# Patient Record
Sex: Male | Born: 1945 | Race: White | Hispanic: No | Smoking: Never smoker
Health system: Southern US, Community
[De-identification: ages and names within clinical notes are randomized; demographics above are authoritative.]

## PROBLEM LIST (undated history)

## (undated) DIAGNOSIS — F419 Anxiety disorder, unspecified: Secondary | ICD-10-CM

## (undated) DIAGNOSIS — R634 Abnormal weight loss: Secondary | ICD-10-CM

## (undated) DIAGNOSIS — E785 Hyperlipidemia, unspecified: Secondary | ICD-10-CM

## (undated) DIAGNOSIS — N200 Calculus of kidney: Secondary | ICD-10-CM

## (undated) DIAGNOSIS — N289 Disorder of kidney and ureter, unspecified: Secondary | ICD-10-CM

## (undated) DIAGNOSIS — B009 Herpesviral infection, unspecified: Secondary | ICD-10-CM

## (undated) DIAGNOSIS — M1990 Unspecified osteoarthritis, unspecified site: Secondary | ICD-10-CM

## (undated) DIAGNOSIS — R9389 Abnormal findings on diagnostic imaging of other specified body structures: Secondary | ICD-10-CM

## (undated) HISTORY — DX: Disorder of kidney and ureter, unspecified: N28.9

## (undated) HISTORY — DX: Herpesviral infection, unspecified: B00.9

## (undated) HISTORY — DX: Hyperlipidemia, unspecified: E78.5

## (undated) HISTORY — PX: URETERAL STENT PLACEMENT: SHX822

## (undated) HISTORY — DX: Abnormal weight loss: R63.4

## (undated) HISTORY — DX: Abnormal findings on diagnostic imaging of other specified body structures: R93.89

## (undated) HISTORY — DX: Anxiety disorder, unspecified: F41.9

## (undated) HISTORY — DX: Calculus of kidney: N20.0

## (undated) HISTORY — PX: OTHER SURGICAL HISTORY: SHX169

---

## 1996-03-06 HISTORY — PX: REFRACTIVE SURGERY: SHX103

## 2005-08-08 ENCOUNTER — Ambulatory Visit: Payer: Self-pay | Admitting: Internal Medicine

## 2005-08-29 ENCOUNTER — Ambulatory Visit: Payer: Self-pay

## 2005-11-28 ENCOUNTER — Ambulatory Visit: Payer: Self-pay | Admitting: Internal Medicine

## 2007-04-03 ENCOUNTER — Telehealth: Payer: Self-pay | Admitting: Internal Medicine

## 2007-04-08 ENCOUNTER — Telehealth: Payer: Self-pay | Admitting: Internal Medicine

## 2007-04-10 ENCOUNTER — Telehealth: Payer: Self-pay | Admitting: Internal Medicine

## 2007-12-11 ENCOUNTER — Ambulatory Visit (HOSPITAL_COMMUNITY): Payer: Self-pay | Admitting: Psychology

## 2007-12-17 ENCOUNTER — Ambulatory Visit (HOSPITAL_COMMUNITY): Payer: Self-pay | Admitting: Psychology

## 2008-10-27 ENCOUNTER — Telehealth (INDEPENDENT_AMBULATORY_CARE_PROVIDER_SITE_OTHER): Payer: Self-pay | Admitting: *Deleted

## 2008-11-03 ENCOUNTER — Ambulatory Visit: Payer: Self-pay | Admitting: Internal Medicine

## 2008-11-03 DIAGNOSIS — J189 Pneumonia, unspecified organism: Secondary | ICD-10-CM

## 2009-05-03 ENCOUNTER — Telehealth: Payer: Self-pay | Admitting: Internal Medicine

## 2010-04-05 NOTE — Progress Notes (Signed)
Summary: Viagra  Phone Note Call from Patient   Summary of Call: Patient left message on triage that he is on Cialis, but would like to go back to Viagra. Patient needs new prescription. Initial call taken by: Lucious Groves,  May 03, 2009 4:32 PM  Follow-up for Phone Call        OK for viagra 100mg : 1/2 or 1 as needed, #6, refill as needed. Follow-up by: Jacques Navy MD,  May 03, 2009 4:40 PM  Additional Follow-up for Phone Call Additional follow up Details #1::        No one at hm #, wk # incorrect Additional Follow-up by: Lamar Sprinkles, CMA,  May 04, 2009 8:07 AM    New/Updated Medications: VIAGRA 100 MG TABS (SILDENAFIL CITRATE) 1/2 or 1 prn Prescriptions: VIAGRA 100 MG TABS (SILDENAFIL CITRATE) 1/2 or 1 prn  #6 x 12   Entered by:   Lamar Sprinkles, CMA   Authorized by:   Jacques Navy MD   Signed by:   Lamar Sprinkles, CMA on 05/04/2009   Method used:   Electronically to        [facility] [phone]* (retail)       [address]       [place], [place]  [address]       Ph: [phone] or [phone]       Fax: [phone]   RxID:   [id number]

## 2010-07-22 NOTE — Letter (Signed)
April 05, 2007    To Whom It May Concern:   RE:  [name], [name]  MRN:  [id number]  /  DOB:  [birth year]   [name] is a patient I follow for general medical care.  He  actually is a healthy gentleman, who had a wellness examination August 08, 2005.  As part of routine evaluation and because of a family history of  atherosclerotic vascular disease, the patient had a 2D echo performed  August 29, 2005.  This study was read out as showing no evidence of any  carotid artery disease.   Based on the patient's physical exam, history and study, I would say  that he has no risk for carotid artery disease or atherosclerotic  vascular disease at this time.   If I can provide any additional information, with appropriate patient  consent, please feel free to contact me.    Sincerely,      Rosalyn Gess. Norins, MD  Electronically Signed    MEN/MedQ  DD: 04/04/2007  DT: 04/05/2007  Job #: [id number]

## 2011-04-15 ENCOUNTER — Encounter: Payer: Self-pay | Admitting: *Deleted

## 2011-04-15 DIAGNOSIS — F329 Major depressive disorder, single episode, unspecified: Secondary | ICD-10-CM | POA: Insufficient documentation

## 2011-04-15 DIAGNOSIS — F419 Anxiety disorder, unspecified: Secondary | ICD-10-CM | POA: Insufficient documentation

## 2011-04-16 ENCOUNTER — Ambulatory Visit: Payer: Self-pay

## 2011-04-16 ENCOUNTER — Other Ambulatory Visit: Payer: Self-pay | Admitting: Internal Medicine

## 2011-04-16 DIAGNOSIS — Z0000 Encounter for general adult medical examination without abnormal findings: Secondary | ICD-10-CM

## 2011-04-17 ENCOUNTER — Ambulatory Visit: Payer: Medicare Other

## 2011-04-17 ENCOUNTER — Ambulatory Visit (INDEPENDENT_AMBULATORY_CARE_PROVIDER_SITE_OTHER): Payer: Medicare Other | Admitting: Internal Medicine

## 2011-04-17 ENCOUNTER — Encounter: Payer: Self-pay | Admitting: Internal Medicine

## 2011-04-17 ENCOUNTER — Ambulatory Visit: Payer: Self-pay | Admitting: Internal Medicine

## 2011-04-17 VITALS — BP 108/76 | HR 56 | Temp 97.8°F | Resp 16 | Ht 73.0 in | Wt 186.0 lb

## 2011-04-17 DIAGNOSIS — R942 Abnormal results of pulmonary function studies: Secondary | ICD-10-CM

## 2011-04-17 DIAGNOSIS — B353 Tinea pedis: Secondary | ICD-10-CM

## 2011-04-17 DIAGNOSIS — M549 Dorsalgia, unspecified: Secondary | ICD-10-CM

## 2011-04-17 DIAGNOSIS — G8929 Other chronic pain: Secondary | ICD-10-CM

## 2011-04-17 DIAGNOSIS — N529 Male erectile dysfunction, unspecified: Secondary | ICD-10-CM

## 2011-04-17 DIAGNOSIS — M545 Low back pain: Secondary | ICD-10-CM

## 2011-04-17 DIAGNOSIS — Z0000 Encounter for general adult medical examination without abnormal findings: Secondary | ICD-10-CM

## 2011-04-17 LAB — POCT URINALYSIS DIPSTICK
Bilirubin, UA: NEGATIVE
Glucose, UA: NEGATIVE
Leukocytes, UA: NEGATIVE
Nitrite, UA: NEGATIVE
Urobilinogen, UA: 0.2
pH, UA: 6

## 2011-04-17 LAB — POCT UA - MICROSCOPIC ONLY
Casts, Ur, LPF, POC: NEGATIVE
Crystals, Ur, HPF, POC: NEGATIVE
Yeast, UA: NEGATIVE

## 2011-04-17 LAB — LIPID PANEL
Cholesterol: 188 mg/dL (ref 0–200)
VLDL: 43 mg/dL — ABNORMAL HIGH (ref 0–40)

## 2011-04-17 LAB — COMPREHENSIVE METABOLIC PANEL
ALT: 33 U/L (ref 0–53)
Albumin: 4.1 g/dL (ref 3.5–5.2)
CO2: 26 mEq/L (ref 19–32)
Chloride: 105 mEq/L (ref 96–112)
Potassium: 4.5 mEq/L (ref 3.5–5.3)
Sodium: 139 mEq/L (ref 135–145)
Total Bilirubin: 0.5 mg/dL (ref 0.3–1.2)
Total Protein: 6.9 g/dL (ref 6.0–8.3)

## 2011-04-17 LAB — CBC WITH DIFFERENTIAL/PLATELET
Eosinophils Absolute: 0.5 10*3/uL (ref 0.0–0.7)
Hemoglobin: 15.4 g/dL (ref 13.0–17.0)
Lymphocytes Relative: 20 % (ref 12–46)
Lymphs Abs: 0.8 10*3/uL (ref 0.7–4.0)
Monocytes Relative: 8 % (ref 3–12)
Neutro Abs: 2.4 10*3/uL (ref 1.7–7.7)
Neutrophils Relative %: 59 % (ref 43–77)
Platelets: 208 10*3/uL (ref 150–400)
RBC: 4.99 MIL/uL (ref 4.22–5.81)
WBC: 4.1 10*3/uL (ref 4.0–10.5)

## 2011-04-17 MED ORDER — SILDENAFIL CITRATE 100 MG PO TABS: 50.0000 mg | ORAL_TABLET | Freq: Every day | ORAL | Status: DC | PRN

## 2011-04-17 MED ORDER — TERBINAFINE HCL 250 MG PO TABS: 250.0000 mg | ORAL_TABLET | Freq: Every day | ORAL | Status: AC

## 2011-04-17 MED ORDER — ALPRAZOLAM 1 MG PO TABS: 1.0000 mg | ORAL_TABLET | Freq: Every evening | ORAL | Status: AC | PRN

## 2011-04-17 NOTE — Progress Notes (Signed)
  Subjective:    Patient ID: [name], male    DOB: September 23, 1945, 66 y.o.   MRN: [id number]  HPISee scanned review of entire hx.    Review of Systems    See scanned hx. Objective:   Physical Exam  Constitutional: He is oriented to person, place, and time. He appears well-developed and well-nourished.  HENT:  Head: Normocephalic.  Eyes: EOM are normal. Pupils are equal, round, and reactive to light.  Neck: Normal range of motion. Neck supple.  Cardiovascular: Normal rate and regular rhythm.   Pulmonary/Chest: Effort normal and breath sounds normal.  Abdominal: Soft. Bowel sounds are normal. He exhibits no mass.  Genitourinary: Rectum normal, prostate normal and penis normal.  Musculoskeletal: Normal range of motion. He exhibits tenderness.  Neurological: He is alert and oriented to person, place, and time. He has normal reflexes. No cranial nerve deficit. Coordination normal.  Skin: Skin is warm and dry.  Psychiatric: He has a normal mood and affect.          Assessment & Plan:  Restrictive PFT Insomnia ED Low back pain with DDD Tinea pedis onychomycosis  Mr, [name] prefers to increase his cardio w/out and retest PFTs in a few months  See meds ordered  [facility] reading (PRIMARY) by  Dr.Tyniya Kuyper  CXR NAD, LS spine severe DDD L 2/3 scoliosis.

## 2011-04-18 ENCOUNTER — Encounter: Payer: Self-pay | Admitting: Internal Medicine

## 2011-04-18 LAB — PULMONARY FUNCTION TEST

## 2012-03-06 HISTORY — PX: URETERAL STENT PLACEMENT: SHX822

## 2012-07-13 ENCOUNTER — Ambulatory Visit (INDEPENDENT_AMBULATORY_CARE_PROVIDER_SITE_OTHER): Payer: Medicare Other | Admitting: Internal Medicine

## 2012-07-13 VITALS — BP 96/78 | HR 75 | Temp 97.7°F | Resp 16 | Ht 73.5 in | Wt 185.6 lb

## 2012-07-13 DIAGNOSIS — E789 Disorder of lipoprotein metabolism, unspecified: Secondary | ICD-10-CM

## 2012-07-13 DIAGNOSIS — Z8249 Family history of ischemic heart disease and other diseases of the circulatory system: Secondary | ICD-10-CM

## 2012-07-13 DIAGNOSIS — M545 Low back pain: Secondary | ICD-10-CM

## 2012-07-13 DIAGNOSIS — B351 Tinea unguium: Secondary | ICD-10-CM

## 2012-07-13 DIAGNOSIS — Z139 Encounter for screening, unspecified: Secondary | ICD-10-CM

## 2012-07-13 DIAGNOSIS — N529 Male erectile dysfunction, unspecified: Secondary | ICD-10-CM

## 2012-07-13 DIAGNOSIS — Z719 Counseling, unspecified: Secondary | ICD-10-CM

## 2012-07-13 DIAGNOSIS — Z0000 Encounter for general adult medical examination without abnormal findings: Secondary | ICD-10-CM

## 2012-07-13 LAB — POCT CBC
Granulocyte percent: 72 %G (ref 37–80)
MCH, POC: 31.5 pg — AB (ref 27–31.2)
MID (cbc): 0.3 (ref 0–0.9)
MPV: 9.3 fL (ref 0–99.8)
POC Granulocyte: 3.5 (ref 2–6.9)
POC MID %: 6.4 %M (ref 0–12)
Platelet Count, POC: 227 10*3/uL (ref 142–424)
RBC: 4.96 M/uL (ref 4.69–6.13)

## 2012-07-13 LAB — LIPID PANEL
HDL: 40 mg/dL (ref >39)
LDL Cholesterol: 108 mg/dL — ABNORMAL HIGH (ref 0–99)
Total CHOL/HDL Ratio: 4.7 Ratio

## 2012-07-13 LAB — COMPREHENSIVE METABOLIC PANEL
ALT: 31 U/L (ref 0–53)
AST: 25 U/L (ref 0–37)
Calcium: 9.7 mg/dL (ref 8.4–10.5)
Chloride: 103 mEq/L (ref 96–112)
Creat: 1.2 mg/dL (ref 0.50–1.35)
Sodium: 137 mEq/L (ref 135–145)
Total Protein: 7.4 g/dL (ref 6.0–8.3)

## 2012-07-13 LAB — POCT URINALYSIS DIPSTICK
Bilirubin, UA: NEGATIVE
Glucose, UA: NEGATIVE
Ketones, UA: NEGATIVE
Leukocytes, UA: NEGATIVE
Nitrite, UA: NEGATIVE
Protein, UA: NEGATIVE
Spec Grav, UA: >=1.03
Urobilinogen, UA: 0.2
pH, UA: 5

## 2012-07-13 LAB — POCT UA - MICROSCOPIC ONLY
Mucus, UA: POSITIVE
Yeast, UA: NEGATIVE

## 2012-07-13 LAB — PSA, MEDICARE: PSA: 0.62 ng/mL (ref <=4.00)

## 2012-07-13 LAB — TSH: TSH: 1.448 u[IU]/mL (ref 0.350–4.500)

## 2012-07-13 LAB — VITAMIN B12: Vitamin B-12: 425 pg/mL (ref 211–911)

## 2012-07-13 MED ORDER — ATORVASTATIN CALCIUM 10 MG PO TABS: 10.0000 mg | ORAL_TABLET | Freq: Every day | ORAL | Status: DC

## 2012-07-13 MED ORDER — SILDENAFIL CITRATE 100 MG PO TABS: 100.0000 mg | ORAL_TABLET | Freq: Every day | ORAL | Status: DC | PRN

## 2012-07-13 NOTE — Progress Notes (Signed)
  Subjective:    Patient ID: [name], male    DOB: [birth year], 67 y.o.   MRN: [id number]  HPI Concerned about strokes in family, pvd prevalent. No smoke, does exercise and is trim.    Review of Systems  Constitutional: Negative.   HENT: Negative.   Respiratory: Negative.   Cardiovascular: Negative.   Gastrointestinal: Negative.   Endocrine: Negative.   Genitourinary: Negative.   Musculoskeletal: Positive for back pain.  Skin: Positive for rash.  Allergic/Immunologic: Negative.   Neurological: Negative.   Hematological: Negative.   Psychiatric/Behavioral: Negative.        Objective:   Physical Exam   Results for orders placed in visit on 07/13/12  POCT CBC      Result Value Range   WBC 4.9  4.6 - 10.2 K/uL   Lymph, poc 1.1  0.6 - 3.4   POC LYMPH PERCENT 21.6  10 - 50 %L   MID (cbc) 0.3  0 - 0.9   POC MID % 6.4  0 - 12 %M   POC Granulocyte 3.5  2 - 6.9   Granulocyte percent 72.0  37 - 80 %G   RBC 4.96  4.69 - 6.13 M/uL   Hemoglobin 15.6  14.1 - 18.1 g/dL   HCT, POC 78.2  95.6 - 53.7 %   MCV 96.5  80 - 97 fL   MCH, POC 31.5 (*) 27 - 31.2 pg   MCHC 32.6  31.8 - 35.4 g/dL   RDW, POC 21.3     Platelet Count, POC 227  142 - 424 K/uL   MPV 9.3  0 - 99.8 fL  POCT UA - MICROSCOPIC ONLY      Result Value Range   WBC, Ur, HPF, POC 0-1     RBC, urine, microscopic 3-4     Bacteria, U Microscopic trace     Mucus, UA pos     Epithelial cells, urine per micros neg     Crystals, Ur, HPF, POC neg     Casts, Ur, LPF, POC neg     Yeast, UA neg    POCT URINALYSIS DIPSTICK      Result Value Range   Color, UA yellow     Clarity, UA clear     Glucose, UA neg     Bilirubin, UA neg     Ketones, UA neg     Spec Grav, UA >=1.030     Blood, UA trace     pH, UA 5.0     Protein, UA neg     Urobilinogen, UA 0.2     Nitrite, UA neg     Leukocytes, UA Negative          Assessment & Plan:

## 2012-07-13 NOTE — Patient Instructions (Addendum)
Peripheral Vascular Disease Peripheral Vascular Disease (PVD), also called Peripheral Arterial Disease (PAD), is a circulation problem caused by cholesterol (atherosclerotic plaque) deposits in the arteries. PVD commonly occurs in the lower extremities (legs) but it can occur in other areas of the body, such as your arms. The cholesterol buildup in the arteries reduces blood flow which can cause pain and other serious problems. The presence of PVD can place a person at risk for Coronary Artery Disease (CAD).  CAUSES  Causes of PVD can be many. It is usually associated with more than one risk factor such as:   High Cholesterol.  Smoking.  Diabetes.  Lack of exercise or inactivity.  High blood pressure (hypertension).  Obesity.  Family history. SYMPTOMS   When the lower extremities are affected, patients with PVD may experience:  Leg pain with exertion or physical activity. This is called INTERMITTENT CLAUDICATION. This may present as cramping or numbness with physical activity. The location of the pain is associated with the level of blockage. For example, blockage at the abdominal level (distal abdominal aorta) may result in buttock or hip pain. Lower leg arterial blockage may result in calf pain.  As PVD becomes more severe, pain can develop with less physical activity.  In people with severe PVD, leg pain may occur at rest.  Other PVD signs and symptoms:  Leg numbness or weakness.  Coldness in the affected leg or foot, especially when compared to the other leg.  A change in leg color.  Patients with significant PVD are more prone to ulcers or sores on toes, feet or legs. These may take longer to heal or may reoccur. The ulcers or sores can become infected.  If signs and symptoms of PVD are ignored, gangrene may occur. This can result in the loss of toes or loss of an entire limb.  Not all leg pain is related to PVD. Other medical conditions can cause leg pain such  as:  Blood clots (embolism) or Deep Vein Thrombosis.  Inflammation of the blood vessels (vasculitis).  Spinal stenosis. DIAGNOSIS  Diagnosis of PVD can involve several different types of tests. These can include:  Pulse Volume Recording Method (PVR). This test is simple, painless and does not involve the use of X-rays. PVR involves measuring and comparing the blood pressure in the arms and legs. An ABI (Ankle-Brachial Index) is calculated. The normal ratio of blood pressures is 1. As this number becomes smaller, it indicates more severe disease.  < 0.95  indicates significant narrowing in one or more leg vessels.  <0.8 there will usually be pain in the foot, leg or buttock with exercise.  <0.4 will usually have pain in the legs at rest.  <0.25  usually indicates limb threatening PVD.  Doppler detection of pulses in the legs. This test is painless and checks to see if you have a pulses in your legs/feet.  A dye or contrast material (a substance that highlights the blood vessels so they show up on x-ray) may be given to help your caregiver better see the arteries for the following tests. The dye is eliminated from your body by the kidney's. Your caregiver may order blood work to check your kidney function and other laboratory values before the following tests are performed:  Magnetic Resonance Angiography (MRA). An MRA is a picture study of the blood vessels and arteries. The MRA machine uses a large magnet to produce images of the blood vessels.  Computed Tomography Angiography (CTA). A CTA is a   specialized x-ray that looks at how the blood flows in your blood vessels. An IV may be inserted into your arm so contrast dye can be injected.  Angiogram. Is a procedure that uses x-rays to look at your blood vessels. This procedure is minimally invasive, meaning a small incision (cut) is made in your groin. A small tube (catheter) is then inserted into the artery of your groin. The catheter is  guided to the blood vessel or artery your caregiver wants to examine. Contrast dye is injected into the catheter. X-rays are then taken of the blood vessel or artery. After the images are obtained, the catheter is taken out. TREATMENT  Treatment of PVD involves many interventions which may include:  Lifestyle changes:  Quitting smoking.  Exercise.  Following a low fat, low cholesterol diet.  Control of diabetes.  Foot care is very important to the PVD patient. Good foot care can help prevent infection.  Medication:  Cholesterol-lowering medicine.  Blood pressure medicine.  Anti-platelet drugs.  Certain medicines may reduce symptoms of Intermittent Claudication.  Interventional/Surgical options:  Angioplasty. An Angioplasty is a procedure that inflates a balloon in the blocked artery. This opens the blocked artery to improve blood flow.  Stent Implant. A wire mesh tube (stent) is placed in the artery. The stent expands and stays in place, allowing the artery to remain open.  Peripheral Bypass Surgery. This is a surgical procedure that reroutes the blood around a blocked artery to help improve blood flow. This type of procedure may be performed if Angioplasty or stent implants are not an option. SEEK IMMEDIATE MEDICAL CARE IF:   You develop pain or numbness in your arms or legs.  Your arm or leg turns cold, becomes blue in color.  You develop redness, warmth, swelling and pain in your arms or legs. MAKE SURE YOU:   Understand these instructions.  Will watch your condition.  Will get help right away if you are not doing well or get worse. Document Released: 03/30/2004 Document Revised: 05/15/2011 Document Reviewed: 02/25/2008 [facility] Patient Information 2013 [place], [place]. Ringworm, Nail A fungal infection of the nail (tinea unguium/onychomycosis) is common. It is common as the visible part of the nail is composed of dead cells which have no blood supply to help  prevent infection. It occurs because fungi are everywhere and will pick any opportunity to grow on any dead material. Because nails are very slow growing they require up to 2 years of treatment with anti-fungal medications. The entire nail back to the base is infected. This includes approximately  of the nail which you cannot see. If your caregiver has prescribed a medication by mouth, take it every day and as directed. No progress will be seen for at least 6 to 9 months. Do not be disappointed! Because fungi live on dead cells with little or no exposure to blood supply, medication delivery to the infection is slow; thus the cure is slow. It is also why you can observe no progress in the first 6 months. The nail becoming cured is the base of the nail, as it has the blood supply. Topical medication such as creams and ointments are usually not effective. Important in successful treatment of nail fungus is closely following the medication regimen that your doctor prescribes. Sometimes you and your caregiver may elect to speed up this process by surgical removal of all the nails. Even this may still require 6 to 9 months of additional oral medications. See your caregiver as directed.  Remember there will be no visible improvement for at least 6 months. See your caregiver sooner if other signs of infection (redness and swelling) develop. Document Released: 02/18/2000 Document Revised: 05/15/2011 Document Reviewed: 04/28/2008 [facility] Patient Information 2013 [place], [place].

## 2012-07-30 ENCOUNTER — Encounter: Payer: Self-pay | Admitting: Radiology

## 2012-07-30 ENCOUNTER — Ambulatory Visit
Admission: RE | Admit: 2012-07-30 | Discharge: 2012-07-30 | Disposition: A | Discharge status: Home / Self Care | Payer: Medicare Other | Source: Ambulatory Visit | Attending: Internal Medicine | Admitting: Internal Medicine

## 2012-07-30 DIAGNOSIS — Z8249 Family history of ischemic heart disease and other diseases of the circulatory system: Secondary | ICD-10-CM

## 2012-11-24 ENCOUNTER — Ambulatory Visit (INDEPENDENT_AMBULATORY_CARE_PROVIDER_SITE_OTHER): Payer: Medicare Other | Admitting: Internal Medicine

## 2012-11-24 VITALS — BP 100/60 | HR 56 | Temp 97.7°F | Resp 16 | Ht 75.0 in | Wt 193.2 lb

## 2012-11-24 DIAGNOSIS — E785 Hyperlipidemia, unspecified: Secondary | ICD-10-CM

## 2012-11-24 DIAGNOSIS — Z5181 Encounter for therapeutic drug level monitoring: Secondary | ICD-10-CM

## 2012-11-24 LAB — COMPREHENSIVE METABOLIC PANEL
ALT: 51 U/L (ref 0–53)
AST: 34 U/L (ref 0–37)
Albumin: 4.1 g/dL (ref 3.5–5.2)
Alkaline Phosphatase: 70 U/L (ref 39–117)
Calcium: 9.4 mg/dL (ref 8.4–10.5)
Chloride: 104 mEq/L (ref 96–112)
Creat: 1.16 mg/dL (ref 0.50–1.35)
Potassium: 4.7 mEq/L (ref 3.5–5.3)

## 2012-11-24 LAB — LIPID PANEL
LDL Cholesterol: 61 mg/dL (ref 0–99)
Total CHOL/HDL Ratio: 4 Ratio

## 2012-11-24 NOTE — Patient Instructions (Addendum)
Immunization Schedule, Adult  Influenza vaccine.  Adults should be given 1 dose every year.  Tetanus, diphtheria, and pertussis (Td, Tdap) vaccine.  Adults who have not previously been given Tdap or who do not know their vaccine status should be given 1 dose of Tdap.  Adults should have a Td booster every 10 years.  Doses should be given if needed to catch up on missed doses in the past.  Pregnant women should be given 1 dose of Tdap vaccine during each pregnancy.  Varicella vaccine.  All adults without evidence of immunity to varicella should receive 2 doses or a second dose if they have received only 1 dose.  Pregnant women who do not have evidence of immunity should be given the first dose after their pregnancy.  Human papillomavirus (HPV) vaccine.  Women aged 4 through 26 years who have not been given the vaccine previously should be given the 3 dose series. The second dose should be given 1 to 2 months after the first dose. The third dose should be given at least 24 weeks after the first dose.  The vaccine is not recommended for use in pregnant women. However, pregnancy testing is not needed before being given a dose. If a woman is found to be pregnant after being given a dose, no treatment is needed. In that case, the remaining doses should be delayed until after the pregnancy.  Men aged 57 through 21 years who have not been given the vaccine previously should be given the 3 dose series. Men aged 85 through 47 years may be given the 3 dose series. The second dose should be given 1 to 2 months after the first dose. The third dose should be given at least 24 weeks after the first dose.  Zoster vaccine.  One dose is recommended for adults aged 86 years and older unless certain conditions are present.  Measles, mumps, and rubella (MMR) vaccine.  Adults born before 13 generally are considered immune to measles and mumps. Healthcare workers born before 1957 who do not have  evidence of immunity should consider vaccination.  Adults born in [birth year] or later should have 1 or more doses of MMR vaccine unless there is a contraindication for the vaccine or they have evidence of immunity to the diseases. A second dose should be given at least 28 days after the first dose. Adults receiving certain types of previous vaccines should consider or be given vaccine doses.  For women of childbearing age, rubella immunity should be determined. If there is no evidence of immunity, women who are not pregnant should be vaccinated. If there is no evidence of immunity, women who are pregnant should delay vaccination until after their pregnancy.  Pneumococcal polysaccharide (PPSV23) vaccine.  All adults aged 17 years and older should be given 1 dose.  Adults younger than age 28 years who have certain medical conditions, who smoke cigarettes, who reside in nursing homes or long-term care facilities, or who have an unknown vaccination history should usually be given 1 or 2 doses of the vaccine.  Pneumococcal 13-valent conjugate (PVC13) vaccine.  Adults aged 20 years or older with certain medical conditions and an unknown or incomplete pneumococcal vaccination history should usually be given 1 dose of the vaccine. This dose may be in addition to a PPSV23 vaccine dose.  Meningococcal vaccine.  First-year college students up to age 44 years who are living in residence halls should be given a dose if they did not receive a dose on  or after their 16th birthday.  A dose should be given to microbiologists working with certain meningitis bacteria, military recruits, and people who travel to or live in countries with a high rate of meningitis.  One or 2 doses should be given to adults who have certain high-risk conditions.  Hepatitis A vaccine.  Adults who wish to be protected from this disease, who have certain high-risk conditions, who work with hepatitis A-infected animals, who work in  hepatitis A research labs, or who travel to or work in countries with a high rate of hepatitis A should be given the 2 dose series of the vaccine.  Adults who were previously unvaccinated and who anticipate close contact with an international adoptee during the first 60 days after arrival in the [place] from a country with a high rate of hepatitis A should be given the vaccine. The first dose of the 2 dose series should be given 2 or more weeks before the arrival of the adoptee.  Hepatitis B vaccine.  Adults who wish to be protected from this disease, who have certain high-risk conditions, who may be exposed to blood or other infectious body fluids, who are household contacts or sex partners of hepatitis B positive people, who are clients or workers in certain care facilities, or who travel to or work in countries with a high rate of hepatitis B should be given the 3 dose series of the vaccine. If you travel outside the [place], additional vaccines may be needed. The Centers for Disease Control and Prevention (CDC) provides information about the vaccines, medicines, and other measures necessary to prevent illness and injury during international travel. Visit the CDC website at http://www.church.org/ or call (800) CDC-INFO [(208)504-0635]. You may also consult a travel clinic or your caregiver. Document Released: 05/13/2003 Document Revised: 05/15/2011 Document Reviewed: 04/07/2011 [facility] Patient Information 2014 [place], [place].

## 2012-11-24 NOTE — Progress Notes (Signed)
  Subjective:    Patient ID: [name], male    DOB: [birth year], 67 y.o.   MRN: [id number]  HPI Doing well, has dyslipidemia on lipitor 2-3 months and needs f/up. Had flu shot  Review of Systems     Objective:   Physical Exam  Vitals reviewed. Constitutional: He is oriented to person, place, and time. He appears well-developed and well-nourished.  HENT:  Head: Normocephalic.  Eyes: EOM are normal.  Neck: Normal range of motion.  Cardiovascular: Normal rate, regular rhythm and normal heart sounds.   Pulmonary/Chest: Effort normal and breath sounds normal.  Neurological: He is alert and oriented to person, place, and time. He exhibits normal muscle tone. Coordination normal.  Psychiatric: He has a normal mood and affect. His behavior is normal. Judgment and thought content normal.          Assessment & Plan:  Dyslipidemia Continue lipitor

## 2012-12-24 ENCOUNTER — Telehealth: Payer: Self-pay | Admitting: Radiology

## 2012-12-24 NOTE — Telephone Encounter (Signed)
Patient will sign up for mychart, then I can release the labs, he is given the activation code. Will release labs this pm. They have requested an email, but this is a HIPAA violation.

## 2012-12-24 NOTE — Telephone Encounter (Signed)
My chart not active yet.

## 2012-12-25 NOTE — Telephone Encounter (Signed)
Still not active 

## 2012-12-26 NOTE — Telephone Encounter (Signed)
Still not active 

## 2012-12-27 NOTE — Telephone Encounter (Signed)
Not active patient was mailed copy

## 2013-01-29 ENCOUNTER — Encounter (HOSPITAL_COMMUNITY): Payer: Self-pay | Admitting: Emergency Medicine

## 2013-01-29 DIAGNOSIS — E785 Hyperlipidemia, unspecified: Secondary | ICD-10-CM | POA: Insufficient documentation

## 2013-01-29 DIAGNOSIS — B009 Herpesviral infection, unspecified: Secondary | ICD-10-CM | POA: Insufficient documentation

## 2013-01-29 DIAGNOSIS — N201 Calculus of ureter: Secondary | ICD-10-CM | POA: Insufficient documentation

## 2013-01-29 DIAGNOSIS — Z79899 Other long term (current) drug therapy: Secondary | ICD-10-CM | POA: Insufficient documentation

## 2013-01-29 DIAGNOSIS — Z9889 Other specified postprocedural states: Secondary | ICD-10-CM | POA: Insufficient documentation

## 2013-01-29 DIAGNOSIS — Z7982 Long term (current) use of aspirin: Secondary | ICD-10-CM | POA: Insufficient documentation

## 2013-01-29 DIAGNOSIS — R112 Nausea with vomiting, unspecified: Secondary | ICD-10-CM | POA: Insufficient documentation

## 2013-01-29 DIAGNOSIS — Z8659 Personal history of other mental and behavioral disorders: Secondary | ICD-10-CM | POA: Insufficient documentation

## 2013-01-29 NOTE — ED Notes (Signed)
Pt. reports right flank pain onset yesterday , denies hematuria or dysuria , no injury or fall . Pt. stated history of kidney stone with stent placement .

## 2013-01-30 ENCOUNTER — Emergency Department (HOSPITAL_COMMUNITY): Payer: Medicare (Managed Care)

## 2013-01-30 ENCOUNTER — Emergency Department (HOSPITAL_COMMUNITY)
Admission: EM | Admit: 2013-01-30 | Discharge: 2013-01-30 | Disposition: A | Discharge status: Home / Self Care | Payer: Medicare (Managed Care) | Attending: Emergency Medicine | Admitting: Emergency Medicine

## 2013-01-30 DIAGNOSIS — N201 Calculus of ureter: Secondary | ICD-10-CM

## 2013-01-30 HISTORY — DX: Calculus of kidney: N20.0

## 2013-01-30 LAB — BASIC METABOLIC PANEL
CO2: 24 mEq/L (ref 19–32)
Calcium: 9.4 mg/dL (ref 8.4–10.5)
Chloride: 98 mEq/L (ref 96–112)
GFR calc Af Amer: 50 mL/min — ABNORMAL LOW (ref >90)
Glucose, Bld: 122 mg/dL — ABNORMAL HIGH (ref 70–99)
Potassium: 4 mEq/L (ref 3.5–5.1)

## 2013-01-30 LAB — CBC WITH DIFFERENTIAL/PLATELET
Basophils Absolute: 0 10*3/uL (ref 0.0–0.1)
Basophils Relative: 0 % (ref 0–1)
Eosinophils Relative: 0 % (ref 0–5)
HCT: 43.9 % (ref 39.0–52.0)
Hemoglobin: 15.6 g/dL (ref 13.0–17.0)
Lymphocytes Relative: 7 % — ABNORMAL LOW (ref 12–46)
Lymphs Abs: 0.7 10*3/uL (ref 0.7–4.0)
MCHC: 35.5 g/dL (ref 30.0–36.0)
Monocytes Absolute: 0.6 10*3/uL (ref 0.1–1.0)
Neutro Abs: 8.6 10*3/uL — ABNORMAL HIGH (ref 1.7–7.7)
Neutrophils Relative %: 87 % — ABNORMAL HIGH (ref 43–77)
RDW: 13.3 % (ref 11.5–15.5)
WBC: 9.9 10*3/uL (ref 4.0–10.5)

## 2013-01-30 LAB — URINALYSIS, ROUTINE W REFLEX MICROSCOPIC
Glucose, UA: NEGATIVE mg/dL
Leukocytes, UA: NEGATIVE
Nitrite: NEGATIVE
Protein, ur: NEGATIVE mg/dL

## 2013-01-30 LAB — URINE MICROSCOPIC-ADD ON

## 2013-01-30 MED ORDER — TAMSULOSIN HCL 0.4 MG PO CAPS
0.4000 mg | ORAL_CAPSULE | Freq: Once | ORAL | Status: DC
  Filled 2013-01-30: qty 1

## 2013-01-30 MED ORDER — SODIUM CHLORIDE 0.9 % IV BOLUS (SEPSIS)
1000.0000 mL | Freq: Once | INTRAVENOUS | Status: AC
  Administered 2013-01-30: 1000 mL via INTRAVENOUS

## 2013-01-30 MED ORDER — HYDROCODONE-ACETAMINOPHEN 5-325 MG PO TABS
1.0000 | ORAL_TABLET | Freq: Once | ORAL | Status: AC
  Administered 2013-01-30: 1 via ORAL
  Filled 2013-01-30: qty 1

## 2013-01-30 MED ORDER — MORPHINE SULFATE 4 MG/ML IJ SOLN
4.0000 mg | Freq: Once | INTRAMUSCULAR | Status: AC
  Administered 2013-01-30: 4 mg via INTRAVENOUS
  Filled 2013-01-30: qty 1

## 2013-01-30 MED ORDER — KETOROLAC TROMETHAMINE 30 MG/ML IJ SOLN: 30.0000 mg | Freq: Once | INTRAMUSCULAR | Status: DC

## 2013-01-30 MED ORDER — HYDROCODONE-ACETAMINOPHEN 5-325 MG PO TABS: 1.0000 | ORAL_TABLET | Freq: Four times a day (QID) | ORAL | Status: DC | PRN

## 2013-01-30 NOTE — ED Notes (Signed)
Pt states he just got out of hospital in [facility] for 6 Kidney stones on left side and had nephro stent placed on Oct.20 and removed on Nov.17th. Pt c/o right side pain in lower back. Pt states he felt minimal pain on left side when he had Kidney Stones but experiencing a lot of pain in right side. Denies abdominal pain/discomofrt.

## 2013-01-30 NOTE — ED Provider Notes (Signed)
CSN: [id number]     Arrival date & time 01/29/13  2324 History   First MD Initiated Contact with Patient 01/30/13 0255     Chief Complaint  Patient presents with  . Flank Pain   (Consider location/radiation/quality/duration/timing/severity/associated sxs/prior Treatment) Patient is a 67 y.o. male presenting with flank pain. The history is provided by the patient. No language interpreter was used.  Flank Pain This is a new problem. The current episode started 6 to 12 hours ago. The problem occurs constantly. Progression since onset: waxing, waning. Pertinent negatives include no chest pain, no abdominal pain, no headaches and no shortness of breath. Associated symptoms comments: Nausea, vomiting . Nothing aggravates the symptoms. Nothing relieves the symptoms. He has tried nothing for the symptoms. The treatment provided no relief.    Past Medical History  Diagnosis Date  . Weight loss   . Abnormal MRI     BRAIN  . Anxiety   . HSV (herpes simplex virus) infection   . Hyperlipidemia   . Nephrolithiasis    Past Surgical History  Procedure Laterality Date  . Ureteral stent placement     Family History  Problem Relation Age of Onset  . Hypertension Brother    History  Substance Use Topics  . Smoking status: Never Smoker   . Smokeless tobacco: Not on file  . Alcohol Use: 3.5 oz/week    7 drink(s) per week     Comment: one drink a day    Review of Systems  Constitutional: Negative for fever, activity change, appetite change and fatigue.  HENT: Negative for congestion, facial swelling, rhinorrhea and trouble swallowing.   Eyes: Negative for photophobia and pain.  Respiratory: Negative for cough, chest tightness and shortness of breath.   Cardiovascular: Negative for chest pain and leg swelling.  Gastrointestinal: Negative for nausea, vomiting, abdominal pain, diarrhea and constipation.  Endocrine: Negative for polydipsia and polyuria.  Genitourinary: Positive for flank pain.  Negative for dysuria, urgency, decreased urine volume and difficulty urinating.  Musculoskeletal: Negative for back pain and gait problem.  Skin: Negative for color change, rash and wound.  Allergic/Immunologic: Negative for immunocompromised state.  Neurological: Negative for dizziness, facial asymmetry, speech difficulty, weakness, numbness and headaches.  Psychiatric/Behavioral: Negative for confusion, decreased concentration and agitation.    Allergies  Review of patient's allergies indicates no known allergies.  Home Medications   Current Outpatient Rx  Name  Route  Sig  Dispense  Refill  . aspirin 81 MG tablet   Oral   Take 81 mg by mouth daily.         Marland Kitchen atorvastatin (LIPITOR) 10 MG tablet   Oral   Take 1 tablet (10 mg total) by mouth daily.   90 tablet   3   . Docosanol (ABREVA) 10 % CREA   Apply externally   Apply 1 application topically 2 (two) times daily as needed (for fever blisters).         . tamsulosin (FLOMAX) 0.4 MG CAPS capsule   Oral   Take 0.4 mg by mouth daily after supper.         Marland Kitchen HYDROcodone-acetaminophen (NORCO/VICODIN) 5-325 MG per tablet   Oral   Take 1 tablet by mouth every 6 (six) hours as needed.   20 tablet   0   . EXPIRED: sildenafil (VIAGRA) 100 MG tablet   Oral   Take 0.5-1 tablets (50-100 mg total) by mouth daily as needed for erectile dysfunction.   24 tablet   3   .  valACYclovir (VALTREX) 1000 MG tablet   Oral   Take 1,000 mg by mouth daily as needed (for fever blisters).           BP 132/65  Pulse 71  Temp(Src) 98 F (36.7 C) (Oral)  Resp 18  SpO2 97% Physical Exam  Constitutional: He is oriented to person, place, and time. He appears well-developed and well-nourished. No distress.  HENT:  Head: Normocephalic and atraumatic.  Mouth/Throat: No oropharyngeal exudate.  Eyes: Pupils are equal, round, and reactive to light.  Neck: Normal range of motion. Neck supple.  Cardiovascular: Normal rate, regular rhythm  and normal heart sounds.  Exam reveals no gallop and no friction rub.   No murmur heard. Pulmonary/Chest: Effort normal and breath sounds normal. No respiratory distress. He has no wheezes. He has no rales.  Abdominal: Soft. Bowel sounds are normal. He exhibits no distension and no mass. There is no tenderness. There is no rebound and no guarding.  Musculoskeletal: Normal range of motion. He exhibits no edema and no tenderness.  Neurological: He is alert and oriented to person, place, and time.  Skin: Skin is warm and dry.  Psychiatric: He has a normal mood and affect.    ED Course  Procedures (including critical care time) Labs Review Labs Reviewed  URINALYSIS, ROUTINE W REFLEX MICROSCOPIC - Abnormal; Notable for the following:    Hgb urine dipstick SMALL (*)    All other components within normal limits  CBC WITH DIFFERENTIAL - Abnormal; Notable for the following:    Neutrophils Relative % 87 (*)    Neutro Abs 8.6 (*)    Lymphocytes Relative 7 (*)    All other components within normal limits  BASIC METABOLIC PANEL - Abnormal; Notable for the following:    Sodium 132 (*)    Glucose, Bld 122 (*)    BUN 24 (*)    Creatinine, Ser 1.59 (*)    GFR calc non Af Amer 43 (*)    GFR calc Af Amer 50 (*)    All other components within normal limits  URINE MICROSCOPIC-ADD ON   Imaging Review Ct Abdomen Pelvis Wo Contrast  01/30/2013   CLINICAL DATA:  Right flank pain.  History of stones.  EXAM: CT ABDOMEN AND PELVIS WITHOUT CONTRAST  TECHNIQUE: Multidetector CT imaging of the abdomen and pelvis was performed following the standard protocol without intravenous contrast.  COMPARISON:  None.  FINDINGS: The lung bases are clear. There is a 4 mm stone in the distal right ureter at the ureterovesical junction with proximal ureterectasis and pyelocaliectasis as well as periureteral and perirenal stranding. Changes are consistent with moderate obstruction. No renal or ureteral stones on the left. No  bladder stones. The bladder is decompressed.  The unenhanced appearance of the liver, spleen, gallbladder, pancreas, adrenal glands, abdominal aorta, inferior vena cava, and retroperitoneal lymph nodes is unremarkable except for vascular calcifications. The stomach, small bowel, and colon are mostly decompressed with stool filling the colon. No free air or free fluid in the abdomen.  Pelvis: Prostate gland is mildly enlarged with calcification present. No free or loculated pelvic fluid collections. The appendix is normal. No evidence of diverticulitis. Wound degenerative changes in the lumbar spine. 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 Degenerative disc disease with adjacent endplate sclerosis at L2-3. No endplate destruction. Changes are likely degenerative. No destructive bone lesions are appreciated.  IMPRESSION: 4 mm stone in the distal right ureter with moderate proximal obstruction.   Electronically Signed  By: Burman Nieves M.D.   On: 01/30/2013 03:53    EKG Interpretation   None       MDM   1. Right ureteral stone    Pt is a 67 y.o. male with Pmhx as above who presents with sudden onset sharp, R flank pain about 8 hrs ago w/ assoc nausea.  Pt has hx of stones requiring intervention and states this feel slike a kidney stone.  VSS, pt in NAD.  Abd exam benign, no CVA tenderness.  UA w/ small blood.  CT stone study ordered showed 4mm stone in distal R ureter w/ moderate obstruction. Cr 1.6 and pt reports was recently improved to 1 after stent removal.  Urine not infected. Pt given 2L NS, IV narcotics, antiemetics and feels improved.  I spoke w/ urology who will see him in [place] later this week.  Will d/c home with norco for pain, pt has rx for flomax.  Return precautions given for new or worsening symptoms including fever, uncontrolled pain.          Shanna Cisco, MD 01/30/13 2113

## 2013-02-09 ENCOUNTER — Ambulatory Visit (INDEPENDENT_AMBULATORY_CARE_PROVIDER_SITE_OTHER): Payer: Medicare Other | Admitting: Internal Medicine

## 2013-02-09 VITALS — BP 126/80 | HR 68 | Temp 98.0°F | Resp 18 | Ht 73.5 in | Wt 188.2 lb

## 2013-02-09 DIAGNOSIS — N200 Calculus of kidney: Secondary | ICD-10-CM

## 2013-02-09 DIAGNOSIS — N289 Disorder of kidney and ureter, unspecified: Secondary | ICD-10-CM

## 2013-02-09 HISTORY — DX: Disorder of kidney and ureter, unspecified: N28.9

## 2013-02-09 HISTORY — DX: Calculus of kidney: N20.0

## 2013-02-09 LAB — BASIC METABOLIC PANEL
CO2: 30 mEq/L (ref 19–32)
Calcium: 9.4 mg/dL (ref 8.4–10.5)
Chloride: 100 mEq/L (ref 96–112)
Glucose, Bld: 100 mg/dL — ABNORMAL HIGH (ref 70–99)
Potassium: 4.3 mEq/L (ref 3.5–5.3)
Sodium: 137 mEq/L (ref 135–145)

## 2013-02-09 LAB — POCT UA - MICROSCOPIC ONLY
Crystals, Ur, HPF, POC: NEGATIVE
Mucus, UA: POSITIVE

## 2013-02-09 LAB — POCT URINALYSIS DIPSTICK
Bilirubin, UA: NEGATIVE
Glucose, UA: NEGATIVE
Nitrite, UA: NEGATIVE
Spec Grav, UA: >=1.03

## 2013-02-09 MED ORDER — CIPROFLOXACIN HCL 500 MG PO TABS: 500.0000 mg | ORAL_TABLET | Freq: Two times a day (BID) | ORAL | Status: DC

## 2013-02-09 NOTE — Progress Notes (Signed)
   Subjective:    Patient ID: [name], male    DOB: 1946/01/13, 67 y.o.   MRN: [id number]  HPI 67 yr old male is here today to have laboratory work to check Creatinine level and blood level in his urine. He was seen at [facility] on 02/04/13. He passed his kidney stone on the same day before going to his appointment. He had obstruction but has improved, need to be sure creatinine is coming down. Feels better   Review of Systems     Objective:   Physical Exam  Constitutional: He is oriented to person, place, and time. He appears well-developed and well-nourished. No distress.  HENT:  Head: Normocephalic.  Eyes: EOM are normal. No scleral icterus.  Pulmonary/Chest: Effort normal.  Abdominal: Soft.  Neurological: He is alert and oriented to person, place, and time. No cranial nerve deficit. He exhibits normal muscle tone. Coordination normal.  Psychiatric: He has a normal mood and affect.    Results for orders placed in visit on 02/09/13  POCT URINALYSIS DIPSTICK      Result Value Range   Color, UA yellow     Clarity, UA clear     Glucose, UA neg     Bilirubin, UA neg     Ketones, UA trace     Spec Grav, UA >=1.030     Blood, UA trace     pH, UA 5.5     Protein, UA trace     Urobilinogen, UA 0.2     Nitrite, UA neg     Leukocytes, UA Negative    POCT UA - MICROSCOPIC ONLY      Result Value Range   WBC, Ur, HPF, POC 2-4     RBC, urine, microscopic 8-10     Bacteria, U Microscopic 1+     Mucus, UA pos     Epithelial cells, urine per micros 0-3     Crystals, Ur, HPF, POC neg     Casts, Ur, LPF, POC neg     Yeast, UA neg     Cs urine      Assessment & Plan:  UTI Cipro 500mg  bid 10d/Hydrate See urologist in [place] as planned Recheck ccua 2 weeks

## 2013-02-09 NOTE — Patient Instructions (Addendum)
Chronic Kidney Disease Chronic kidney disease occurs when the kidneys are damaged over a long period. The kidneys are two organs that lie on either side of the spine between the middle of the back and the front of the abdomen. The kidneys:   Remove wastes and extra water from the blood.   Produce important hormones. These help keep bones strong, regulate blood pressure, and help create red blood cells.   Balance the fluids and chemicals in the blood and tissues. A small amount of kidney damage may not cause problems, but a large amount of damage may make it difficult or impossible for the kidneys to work the way they should. If steps are not taken to slow down the kidney damage or stop it from getting worse, the kidneys may stop working permanently. Most of the time, chronic kidney disease does not go away. However, it can often be controlled, and those with the disease can usually live normal lives. CAUSES  The most common causes of chronic kidney disease are diabetes and high blood pressure (hypertension). Chronic kidney disease may also be caused by:   Diseases that cause kidneys' filters to become inflamed.   Diseases that affect the immune system.   Genetic diseases.   Medicines that damage the kidneys, such as anti-inflammatory medicines.  Poisoning or exposure to toxic substances.   A reoccurring kidney or urinary infection.   A problem with urine flow. This may be caused by:   Cancer.   Kidney stones.   An enlarged prostate in males. SYMPTOMS  Because the kidney damage in chronic kidney disease occurs slowly, symptoms develop slowly and may not be obvious until the kidney damage becomes severe. A person may have a kidney disease for years without showing any symptoms. Symptoms can include:   Swelling (edema) of the legs, ankles, or feet.   Tiredness (lethargy).   Nausea or vomiting.   Confusion.   Problems with urination, such as:   Decreased urine  production.   Frequent urination, especially at night.   Frequent accidents in children who are potty trained.   Muscle twitches and cramps.   Shortness of breath.  Weakness.   Persistent itchiness.   Loss of appetite.  Metallic taste in the mouth.  Trouble sleeping.  Slowed development in children.  Short stature in children. DIAGNOSIS  Chronic kidney disease may be detected and diagnosed by tests, including blood, urine, imaging, or kidney biopsy tests.  TREATMENT  Most chronic kidney diseases cannot be cured. Treatment usually involves relieving symptoms and preventing or slowing the progression of the disease. Treatment may include:   A special diet. You may need to avoid alcohol and foods thatare salty and high in potassium.   Medicines. These may:   Lower blood pressure.   Relieve anemia.   Relieve swelling.   Protect the bones. HOME CARE INSTRUCTIONS   Follow your prescribed diet.   Only take over-the-counter or prescription medicines as directed by your caregiver.  Do not take any new medicines (prescription, over-the-counter, or nutritional supplements) unless approved by your caregiver. Many medicines can worsen your kidney damage or need to have the dose adjusted.   Quit smoking if you are a smoker. Talk to your caregiver about a smoking cessation program.   Keep all follow-up appointments as directed by your caregiver. SEEK IMMEDIATE MEDICAL CARE IF:  Your symptoms get worse or you develop new symptoms.   You develop symptoms of end-stage kidney disease. These include:   Headaches.  Abnormally dark or light skin.   Numbness in the hands or feet.   Easy bruising.   Frequent hiccups.   Menstruation stops.   You have a fever.   You have decreased urine production.   You havepain or bleeding when urinating. MAKE SURE YOU:  Understand these instructions.  Will watch your condition.  Will get help right  away if you are not doing well or get worse. FOR MORE INFORMATION  American Association of Kidney Patients: ResidentialShow.is National Kidney Foundation: www.kidney.org American Kidney Fund: FightingMatch.com.ee [facility]: www.lifeoptions.org and www.kidneyschool.org Document Released: 11/30/2007 Document Revised: 02/07/2012 Document Reviewed: 10/20/2011 [facility] Patient Information 2014 [place], [place]. Diet for Kidney Stones Kidney stones are small, hard masses that form inside your kidneys. They are made up of salts and minerals and often form when high levels build up in the urine. The minerals can then start to build up, crystalize, and stick together to form stones. There are several different types of kidney stones. The following types of stones may be influenced by dietary factors:   Calcium Oxalate Stones. An oxalate is a salt found in certain foods. Within the body, calcium can combine with oxalates to form calcium oxalate stones, which can be excreted in the urine in high amounts. This is the most common type of kidney stone.  Calcium Phosphate Stones. These stones may occur when the pH of the urine becomes too high, or less acidic, from too much calcium being excreted in the urine. The pH is a measure of how acidic or basic a substance is.  Uric Acid Stones. This type of stone occurs when the pH of the urine becomes too low, or very acidic, because substances called purines build up in the urine. Purines are found in animal proteins. When the urine is highly concentrated with acid, uric acid kidney stones can form.  Other risk factors for kidney stones include genetics, environment, and being overweight. Your caregiver may ask you to follow specific diet guidelines based on the type of stone you have to lessen the chances of your body making more kidney stones.  GENERAL GUIDELINES FOR ALL TYPES OF STONES  Drink plenty of fluid. Drink 12 16 cups of fluid a day, drinking  mainly water.This is the most important thing you can do to prevent the formation of future kidney stones.  Maintain a healthy weight. Your caregiver or dietitian can help you determine what a healthy weight is for you. If you are overweight, weight loss may help prevent the formation of future kidney stones.  Eat a diet adequate in animal protein. Too much animal protein can contribute to the formation of stones. Your dietitian can help you determine how much protein you should be eating. Avoid low carbohydrate, high protein diets.  Follow a balanced eating approach. The DASH diet, which stands for "Dietary Approaches to Stop Hypertension," is an effective meal plan for reducing stone formation. This diet is high in fruits, vegetables, dairy, and whole grains and low in animal protein. Ask your caregiver or dietitian for information about the DASH diet. ADDITIONAL DIET GUIDELINES FOR CALCIUM STONES Avoid foods high in salt. This includes table salt, salt seasonings, MSG, soy sauce, cured and processed meats, salted crackers and snack foods, fast food, and canned soups and foods. Ask your caregiver or dietitian for information about reducing sodium in your diet or following the low sodium diet.  Ensure adequate calcium intake. Use the following table for calcium guidelines:  Men 65 years  old and younger  1000 mg/day.  Men 23 years old and older  1500 mg/day.  Women 74 67 years old  1000 mg/day.  Women 50 years and older  1500 mg/day. Your dietitian can help you determine if you are getting enough calcium in your diet. Foods that are high in calcium include dairy products, broccoli, cheese, yogurt, and pudding. If you need to take a calcium supplement, take it only in the form of calcium citrate.  Avoid foods high in oxalate. Be sure that any supplements you take do not contain more than 500 mg of vitamin C. Vitamin C is converted into oxalate in the body. You do not need to avoid fruits and  vegetables high in vitamin C.   Grains: High-fiber or bran cereal, whole-wheat bread, grits, barley, buckwheat, amaranth, pretzels, and fruitcake.  Vegetables: Dried beans, wax beans, dark leafy greens, eggplant, leeks, okra, parsley, rutabaga, tomato paste, watercress, zucchini, and escarole.  Fruit: Dried apricots, red currants, figs, kiwi, and rhubarb.  Meat and Meat Substitutes: Soybeans and foods made from soy (soyburger, miso), dried beans, peanut butter.  Milk: Chocolate milk mixes and soymilk.  Fats and Oils: Nuts (peanuts, almonds, pecans, cashews, hazelnuts) and nut butters, sesame seeds, and tDahini paste.  Condiments/Miscellaneous: Chocolate, carob, marmalade, poppy seeds, instant iced tea, and juice from high-oxalate fruits.  Document Released: 06/17/2010 Document Revised: 08/22/2011 Document Reviewed: 08/07/2011 [facility] Patient Information 2014 [place], [place]. Urinary Tract Infection Urinary tract infections (UTIs) can develop anywhere along your urinary tract. Your urinary tract is your body's drainage system for removing wastes and extra water. Your urinary tract includes two kidneys, two ureters, a bladder, and a urethra. Your kidneys are a pair of bean-shaped organs. Each kidney is about the size of your fist. They are located below your ribs, one on each side of your spine. CAUSES Infections are caused by microbes, which are microscopic organisms, including fungi, viruses, and bacteria. These organisms are so small that they can only be seen through a microscope. Bacteria are the microbes that most commonly cause UTIs. SYMPTOMS  Symptoms of UTIs may vary by age and gender of the patient and by the location of the infection. Symptoms in young women typically include a frequent and intense urge to urinate and a painful, burning feeling in the bladder or urethra during urination. Older women and men are more likely to be tired, shaky, and weak and have muscle aches and  abdominal pain. A fever may mean the infection is in your kidneys. Other symptoms of a kidney infection include pain in your back or sides below the ribs, nausea, and vomiting. DIAGNOSIS To diagnose a UTI, your caregiver will ask you about your symptoms. Your caregiver also will ask to provide a urine sample. The urine sample will be tested for bacteria and white blood cells. White blood cells are made by your body to help fight infection. TREATMENT  Typically, UTIs can be treated with medication. Because most UTIs are caused by a bacterial infection, they usually can be treated with the use of antibiotics. The choice of antibiotic and length of treatment depend on your symptoms and the type of bacteria causing your infection. HOME CARE INSTRUCTIONS  If you were prescribed antibiotics, take them exactly as your caregiver instructs you. Finish the medication even if you feel better after you have only taken some of the medication.  Drink enough water and fluids to keep your urine clear or pale yellow.  Avoid caffeine, tea, and carbonated beverages.  They tend to irritate your bladder.  Empty your bladder often. Avoid holding urine for long periods of time.  Empty your bladder before and after sexual intercourse.  After a bowel movement, women should cleanse from front to back. Use each tissue only once. SEEK MEDICAL CARE IF:   You have back pain.  You develop a fever.  Your symptoms do not begin to resolve within 3 days. SEEK IMMEDIATE MEDICAL CARE IF:   You have severe back pain or lower abdominal pain.  You develop chills.  You have nausea or vomiting.  You have continued burning or discomfort with urination. MAKE SURE YOU:   Understand these instructions.  Will watch your condition.  Will get help right away if you are not doing well or get worse. Document Released: 11/30/2004 Document Revised: 08/22/2011 Document Reviewed: 03/31/2011 [facility] Patient Information 2014  [place], [place].

## 2013-02-09 NOTE — Progress Notes (Signed)
   Subjective:    Patient ID: [name], male    DOB: Mar 02, 1946, 67 y.o.   MRN: [id number]  HPI    Review of Systems     Objective:   Physical Exam        Assessment & Plan:

## 2013-02-10 ENCOUNTER — Encounter: Payer: Self-pay | Admitting: Radiology

## 2013-02-11 LAB — URINE CULTURE: Organism ID, Bacteria: NO GROWTH

## 2013-04-30 DIAGNOSIS — Z0271 Encounter for disability determination: Secondary | ICD-10-CM

## 2013-05-08 ENCOUNTER — Telehealth: Payer: Self-pay | Admitting: Family Medicine

## 2013-05-08 NOTE — Telephone Encounter (Signed)
Insurance: Florestine Avers,   [employer]  [place].   [phone]    Would like to know exactly what was abnormal with his MRI? Please advise.

## 2013-05-14 ENCOUNTER — Telehealth: Payer: Self-pay

## 2013-05-14 NOTE — Telephone Encounter (Signed)
[employer] is requesting the MRI from 2009  Previously requested the last three years of ov notes with written release,  Mr. Hert spoke with Dr. Elder Cyphers about obtaining this information yesterday (may be a duplicate request)  [phone]  Fax   [phone]  [facility]

## 2013-05-14 NOTE — Telephone Encounter (Signed)
Request printed and sent to Disability desk

## 2013-05-29 ENCOUNTER — Encounter: Payer: Self-pay | Admitting: Family Medicine

## 2013-06-19 ENCOUNTER — Other Ambulatory Visit: Payer: Self-pay | Admitting: Internal Medicine

## 2013-06-26 ENCOUNTER — Ambulatory Visit (INDEPENDENT_AMBULATORY_CARE_PROVIDER_SITE_OTHER): Payer: Medicare Other | Admitting: Internal Medicine

## 2013-06-26 VITALS — BP 96/72 | HR 58 | Temp 97.5°F | Resp 18 | Wt 191.0 lb

## 2013-06-26 DIAGNOSIS — Z823 Family history of stroke: Secondary | ICD-10-CM

## 2013-06-26 DIAGNOSIS — Z8249 Family history of ischemic heart disease and other diseases of the circulatory system: Secondary | ICD-10-CM

## 2013-06-26 DIAGNOSIS — E785 Hyperlipidemia, unspecified: Secondary | ICD-10-CM

## 2013-06-26 DIAGNOSIS — Z79899 Other long term (current) drug therapy: Secondary | ICD-10-CM

## 2013-06-26 LAB — COMPREHENSIVE METABOLIC PANEL
ALT: 30 U/L (ref 0–53)
AST: 26 U/L (ref 0–37)
Albumin: 4.3 g/dL (ref 3.5–5.2)
Alkaline Phosphatase: 65 U/L (ref 39–117)
BUN: 23 mg/dL (ref 6–23)
CALCIUM: 9.4 mg/dL (ref 8.4–10.5)
CO2: 29 meq/L (ref 19–32)
CREATININE: 1.13 mg/dL (ref 0.50–1.35)
Chloride: 102 mEq/L (ref 96–112)
Glucose, Bld: 103 mg/dL — ABNORMAL HIGH (ref 70–99)
POTASSIUM: 4.8 meq/L (ref 3.5–5.3)
Sodium: 137 mEq/L (ref 135–145)
Total Bilirubin: 0.5 mg/dL (ref 0.2–1.2)
Total Protein: 7.2 g/dL (ref 6.0–8.3)

## 2013-06-26 LAB — LIPID PANEL
CHOLESTEROL: 148 mg/dL (ref 0–200)
HDL: 48 mg/dL (ref >39)
LDL CALC: 76 mg/dL (ref 0–99)
Total CHOL/HDL Ratio: 3.1 Ratio
Triglycerides: 119 mg/dL (ref <150)
VLDL: 24 mg/dL (ref 0–40)

## 2013-06-26 MED ORDER — ATORVASTATIN CALCIUM 20 MG PO TABS: 20.0000 mg | ORAL_TABLET | Freq: Every day | ORAL | Status: DC

## 2013-06-26 NOTE — Progress Notes (Signed)
   Subjective:    Patient ID: [name], male    DOB: [birth year], 68 y.o.   MRN: [id number]  HPI Feels great. Strong family hx stoke , PVD, CAD. He requests doppler of carotids.   Review of Systems     Objective:   Physical Exam  Constitutional: He is oriented to person, place, and time. He appears well-developed and well-nourished.  HENT:  Head: Normocephalic.  Eyes: EOM are normal.  Neck: Normal range of motion. Neck supple. No thyromegaly present.  Cardiovascular: Normal rate, regular rhythm, normal heart sounds and intact distal pulses.  Exam reveals no gallop.   No murmur heard. Pulmonary/Chest: Effort normal and breath sounds normal.  Abdominal: Soft. Bowel sounds are normal.  Musculoskeletal: Normal range of motion. He exhibits tenderness.  Lymphadenopathy:    He has no cervical adenopathy.    He has no axillary adenopathy.  Neurological: He is alert and oriented to person, place, and time. Coordination normal.  Psychiatric: He has a normal mood and affect. His behavior is normal. Judgment and thought content normal.   88/52 by me manual 95/61 by machine       Assessment & Plan:  Refer to cardiology--Mike Burt Knack MD Increase Lipitor 20mg  qhs

## 2013-06-26 NOTE — Patient Instructions (Addendum)
Tamsulosin capsules What is this medicine? TAMSULOSIN (tam SOO loe sin) is used to treat enlargement of the prostate gland in men, a condition called benign prostatic hyperplasia or BPH. It is not for use in women. It works by relaxing muscles in the prostate and bladder neck. This improves urine flow and reduces BPH symptoms. This medicine may be used for other purposes; ask your health care provider or pharmacist if you have questions. COMMON BRAND NAME(S): Flomax What should I tell my health care provider before I take this medicine? They need to know if you have any of the following conditions: -advanced kidney disease -advanced liver disease -low blood pressure -prostate cancer -an unusual or allergic reaction to tamsulosin, sulfa drugs, other medicines, foods, dyes, or preservatives -pregnant or trying to get pregnant -breast-feeding How should I use this medicine? Take this medicine by mouth about 30 minutes after the same meal every day. Follow the directions on the prescription label. Swallow the capsules whole with a glass of water. Do not crush, chew, or open capsules. Do not take your medicine more often than directed. Do not stop taking your medicine unless your doctor tells you to. Talk to your pediatrician regarding the use of this medicine in children. Special care may be needed. Overdosage: If you think you have taken too much of this medicine contact a poison control center or emergency room at once. NOTE: This medicine is only for you. Do not share this medicine with others. What if I miss a dose? If you miss a dose, take it as soon as you can. If it is almost time for your next dose, take only that dose. Do not take double or extra doses. If you stop taking your medicine for several days or more, ask your doctor or health care professional what dose you should start back on. What may interact with this medicine? -cimetidine -fluoxetine -ketoconazole -medicines for  erectile disfunction like sildenafil, tadalafil, vardenafil -medicines for high blood pressure -other alpha-blockers like alfuzosin, doxazosin, phentolamine, phenoxybenzamine, prazosin, terazosin -warfarin This list may not describe all possible interactions. Give your health care provider a list of all the medicines, herbs, non-prescription drugs, or dietary supplements you use. Also tell them if you smoke, drink alcohol, or use illegal drugs. Some items may interact with your medicine. What should I watch for while using this medicine? Visit your doctor or health care professional for regular check ups. You will need lab work done before you start this medicine and regularly while you are taking it. Check your blood pressure as directed. Ask your health care professional what your blood pressure should be, and when you should contact him or her. This medicine may make you feel dizzy or lightheaded. This is more likely to happen after the first dose, after an increase in dose, or during hot weather or exercise. Drinking alcohol and taking some medicines can make this worse. Do not drive, use machinery, or do anything that needs mental alertness until you know how this medicine affects you. Do not sit or stand up quickly. If you begin to feel dizzy, sit down until you feel better. These effects can decrease once your body adjusts to the medicine. Contact your doctor or health care professional right away if you have an erection that lasts longer than 4 hours or if it becomes painful. This may be a sign of a serious problem and must be treated right away to prevent permanent damage. If you are thinking of having cataract   surgery, tell your eye surgeon that you have taken this medicine. What side effects may I notice from receiving this medicine? Side effects that you should report to your doctor or health care professional as soon as possible: -allergic reactions like skin rash or itching, hives, swelling  of the lips, mouth, tongue, or throat -breathing problems -change in vision -feeling faint or lightheaded -irregular heartbeat -prolonged or painful erection -weakness Side effects that usually do not require medical attention (report to your doctor or health care professional if they continue or are bothersome): -back pain -change in sex drive or performance -constipation, nausea or vomiting -cough -drowsy -runny or stuffy nose -trouble sleeping This list may not describe all possible side effects. Call your doctor for medical advice about side effects. You may report side effects to FDA at 1-800-FDA-1088. Where should I keep my medicine? Keep out of the reach of children. Store at room temperature between 15 and 30 degrees C (59 and 86 degrees F). Throw away any unused medicine after the expiration date. NOTE: This sheet is a summary. It may not cover all possible information. If you have questions about this medicine, talk to your doctor, pharmacist, or health care provider.  2014, Elsevier/Gold Standard. (2012-02-21 14:11:34) Direct Low-Density Lipoprotein Cholesterol This is a test used to help determine your risk of developing heart disease and to monitor lipid-lowering lifestyle changes and drug therapies. To accurately determine your low-density lipoprotein (LDL) level when you are non-fasting. The direct low-density lipoprotein test (DLDL or Direct LDL) provides an actual measurement of LDL cholesterol, the "bad" cholesterol in blood. Ordinarily, when a lipid profile is ordered, LDL cholesterol is calculated using the results of the total cholesterol, the HDL cholesterol (high-density lipoprotein, the "good" cholesterol), and the triglycerides tests. However, calculation cannot be used if triglyceride levels are above 400 mg/dl or if the patient is not fasting. DLDL has now become a useful tool in lipid management because it allows measurement of LDL cholesterol in either of these  circumstances. Since the direct measurement of LDL cholesterol is less affected by triglyceride concentrations than the calculation of LDL cholesterol, the DLDL test can be used when the patient is not fastingor when the patient has significantly elevated triglycerides.  Elevated levels of LDL, as measured with the DLDL, indicate a greater risk of developing heart disease. Decreasing levels show a response to lipid-lowering lifestyle changes or drug therapies and indicate a decreased risk of heart disease. NORMAL FINDINGS LDL: 60 to 180 mg/dl or less than 3.37 mmol/L (SI units) Ranges for normal findings may vary among different laboratories and hospitals. You should always check with your doctor after having lab work or other tests done to discuss the meaning of your test results and whether your values are considered within normal limits. MEANING OF TEST  Your caregiver will go over the test results with you and discuss the importance and meaning of your results, as well as treatment options and the need for additional tests if necessary. OBTAINING THE TEST RESULTS It is your responsibility to obtain your test results. Ask the lab or department performing the test when and how you will get your results. Document Released: 03/16/2004 Document Revised: 05/15/2011 Document Reviewed: 01/31/2008 [facility] Patient Information 2014 [place], [place].

## 2013-06-30 ENCOUNTER — Other Ambulatory Visit: Payer: Self-pay | Admitting: *Deleted

## 2013-06-30 MED ORDER — VALACYCLOVIR HCL 1 G PO TABS
1000.0000 mg | ORAL_TABLET | Freq: Three times a day (TID) | ORAL | Status: DC
Start: 2013-06-30 — End: 2017-02-06

## 2013-09-02 ENCOUNTER — Institutional Professional Consult (permissible substitution): Payer: Medicare Other | Admitting: Cardiovascular Disease

## 2013-09-12 ENCOUNTER — Ambulatory Visit: Payer: Medicare Other | Admitting: Cardiovascular Disease

## 2013-09-16 ENCOUNTER — Telehealth: Payer: Self-pay | Admitting: Cardiovascular Disease

## 2013-09-16 NOTE — Telephone Encounter (Signed)
°  Follow up:   Per pt he is back in town and would like to see Dr Burt Knack as a new pt,  Per referral by Dr Lia Foyer and Lou Miner.   6/30 appt canceled:  Specialty Services Required. Referred by [facility] for hyperlipidemia and family hx of heart attack. [place] per [facility].

## 2013-09-22 NOTE — Telephone Encounter (Signed)
Pt scheduled to see Dr Burt Knack on 12/01/13.

## 2013-12-01 ENCOUNTER — Ambulatory Visit (INDEPENDENT_AMBULATORY_CARE_PROVIDER_SITE_OTHER): Payer: Medicare Other | Admitting: Cardiovascular Disease

## 2013-12-01 ENCOUNTER — Encounter: Payer: Self-pay | Admitting: Cardiovascular Disease

## 2013-12-01 VITALS — BP 108/72 | HR 66 | Ht 74.0 in | Wt 194.0 lb

## 2013-12-01 DIAGNOSIS — Z139 Encounter for screening, unspecified: Secondary | ICD-10-CM

## 2013-12-01 DIAGNOSIS — Z8249 Family history of ischemic heart disease and other diseases of the circulatory system: Secondary | ICD-10-CM

## 2013-12-01 DIAGNOSIS — E7800 Pure hypercholesterolemia, unspecified: Secondary | ICD-10-CM

## 2013-12-01 DIAGNOSIS — Z136 Encounter for screening for cardiovascular disorders: Secondary | ICD-10-CM

## 2013-12-01 NOTE — Progress Notes (Signed)
HPI:  68 year old gentleman presenting for a wellness/cardiovascular prevention visit. The patient has a history of hyperlipidemia and family history of cardiovascular disease. He has been treated with atorvastatin now for greater than one year. Recent lipid panel as outlined:  Lipid Panel     Component Value Date/Time   CHOL 148 06/26/2013 1018   TRIG 119 06/26/2013 1018   HDL 48 06/26/2013 1018   CHOLHDL 3.1 06/26/2013 1018   VLDL 24 06/26/2013 1018   LDLCALC 76 06/26/2013 1018   The patient's family history includes stroke. His mother had her first stroke at age 69. She had a carotid endarterectomy around that same time. At age 53 she had coronary bypass surgery. His father also had multiple TIAs. The patient has a brother with coronary artery disease who had coronary stents at age 29. He also ultimately underwent aortic valve replacement and coronary bypass surgery at age 23. Another sister has had a stroke probably related to atrial fibrillation in her 23s.  The patient has no complaints. He specifically denies chest pain, shortness of breath, edema, palpitations, orthopnea, or PND. He exercises regularly without exertional symptoms. He has never had any major surgeries or hospitalizations. He has undergone ureteral stent placement for symptomatic nephrolithiasis. He is a lifelong nonsmoker. He drinks alcohol socially. He is retired from the [employer] copy. He spends part of the year in [place].  Outpatient Encounter Prescriptions as of 12/01/2013  Medication Sig  . aspirin 81 MG tablet Take 81 mg by mouth daily.  Marland Kitchen atorvastatin (LIPITOR) 20 MG tablet Take 1 tablet (20 mg total) by mouth daily.  . Silodosin (RAPAFLO PO) Take 0.4 mg by mouth every evening.  . valACYclovir (VALTREX) 1000 MG tablet Take 1 tablet (1,000 mg total) by mouth 3 (three) times daily.  . sildenafil (VIAGRA) 100 MG tablet Take 0.5-1 tablets (50-100 mg total) by mouth daily as needed for erectile dysfunction.   . [DISCONTINUED] atorvastatin (LIPITOR) 10 MG tablet TAKE 1 TABLET (10 MG TOTAL) BY MOUTH DAILY.  . [DISCONTINUED] HYDROcodone-acetaminophen (NORCO/VICODIN) 5-325 MG per tablet Take 1 tablet by mouth every 6 (six) hours as needed.  . [DISCONTINUED] tamsulosin (FLOMAX) 0.4 MG CAPS capsule Take 0.4 mg by mouth daily after supper.    Review of patient's allergies indicates no known allergies.  Past Medical History  Diagnosis Date  . Weight loss   . Abnormal MRI     BRAIN  . Anxiety   . HSV (herpes simplex virus) infection   . Hyperlipidemia   . Nephrolithiasis     Past Surgical History  Procedure Laterality Date  . Ureteral stent placement      History   Social History  . Marital Status: Married    Spouse Name: N/A    Number of Children: 2  . Years of Education: N/A   Occupational History  . RETIRED    Social History Main Topics  . Smoking status: Never Smoker   . Smokeless tobacco: Not on file  . Alcohol Use: 3.5 oz/week    7 drink(s) per week     Comment: one drink a day  . Drug Use: No  . Sexual Activity: Yes   Other Topics Concern  . Not on file   Social History Narrative   SOME EXERCISE.    Family History  Problem Relation Age of Onset  . Hypertension Brother     ROS:  General: no fevers/chills/night sweats Eyes: no blurry vision, diplopia, or amaurosis ENT: no sore throat  or hearing loss Resp: no cough, wheezing, or hemoptysis CV: no edema or palpitations GI: no abdominal pain, nausea, vomiting, diarrhea, or constipation GU: Positive for nephrolithiasis Skin: no rash Neuro: no headache, numbness, tingling, or weakness of extremities Musculoskeletal: no joint pain or swelling Heme: no bleeding, DVT, or easy bruising Endo: no polydipsia or polyuria  BP 108/72  Pulse 66  Ht 6\' 2"  (1.88 m)  Wt 194 lb (87.998 kg)  BMI 24.90 kg/m2  PHYSICAL EXAM: Pt is alert and oriented, WD, WN, in no distress. HEENT: normal Neck: JVP normal. Carotid  upstrokes normal without bruits. No thyromegaly. Lungs: equal expansion, clear bilaterally CV: Apex is discrete and nondisplaced, RRR without murmur or gallop Abd: soft, NT, +BS, no bruit, no hepatosplenomegaly Back: no CVA tenderness Ext: no C/C/E        DP/PT pulses intact and = Skin: warm and dry without rash Neuro: CNII-XII intact             Strength intact = bilaterally  EKG:  Normal sinus rhythm 66 beats per minute, within normal limits.  ASSESSMENT AND PLAN: 1. Hyperlipidemia, well-controlled on atorvastatin 2. Strong family history of coronary and vascular disease  I have recommended a non-imaging exercise treadmill study to evaluate exercise tolerance and screening for obstructive CAD. The patient has undergone carotid duplex scanning with no significant stenoses identified. We reviewed considerations of further evaluation for subclinical atherosclerosis. He will undergo a cardiac CT calcium scan as well as an abdominal aortic duplex for AAA screening. As long as his studies are within expected limits, I will plan on seeing him back in one year. I think he is on appropriate medical therapy with aspirin and atorvastatin. He was encouraged to continue with his exercise program.  Sherren Mocha  MD  12/01/2013 2:36 PM

## 2013-12-01 NOTE — Patient Instructions (Signed)
Your physician has requested that you have an exercise tolerance test in December with PA/NP. For further information please visit [email or web address]. Please also follow instruction sheet, as given.  Your physician has requested that you have an abdominal aorta duplex in December. During this test, an ultrasound is used to evaluate the aorta. Allow 30 minutes for this exam. Do not eat after midnight the day before and avoid carbonated beverages  Your physician has recommended a Cardiac Calcium Score in December.   Your physician wants you to follow-up in: 1 YEAR with Dr Burt Knack.  You will receive a reminder letter in the mail two months in advance. If you don't receive a letter, please call our office to schedule the follow-up appointment.  Your physician recommends that you continue on your current medications as directed. Please refer to the Current Medication list given to you today.

## 2013-12-03 ENCOUNTER — Other Ambulatory Visit (HOSPITAL_COMMUNITY): Payer: Self-pay | Admitting: Cardiovascular Disease

## 2013-12-03 DIAGNOSIS — Z8249 Family history of ischemic heart disease and other diseases of the circulatory system: Secondary | ICD-10-CM

## 2013-12-22 ENCOUNTER — Encounter (HOSPITAL_COMMUNITY): Payer: Medicare Other

## 2013-12-22 ENCOUNTER — Encounter: Payer: Medicare Other | Admitting: Physician Assistant

## 2014-02-17 ENCOUNTER — Inpatient Hospital Stay: Admission: RE | Admit: 2014-02-17 | Payer: Medicare Other | Source: Ambulatory Visit

## 2014-02-18 ENCOUNTER — Ambulatory Visit (INDEPENDENT_AMBULATORY_CARE_PROVIDER_SITE_OTHER): Payer: Medicare Other | Admitting: Physician Assistant

## 2014-02-18 ENCOUNTER — Ambulatory Visit (INDEPENDENT_AMBULATORY_CARE_PROVIDER_SITE_OTHER)
Admission: RE | Admit: 2014-02-18 | Discharge: 2014-02-18 | Disposition: A | Discharge status: Home / Self Care | Payer: Medicare Other | Source: Ambulatory Visit | Attending: Cardiovascular Disease | Admitting: Cardiovascular Disease

## 2014-02-18 ENCOUNTER — Ambulatory Visit (HOSPITAL_COMMUNITY): Payer: Medicare Other | Attending: Cardiology | Admitting: Cardiology

## 2014-02-18 DIAGNOSIS — I700 Atherosclerosis of aorta: Secondary | ICD-10-CM | POA: Diagnosis present

## 2014-02-18 DIAGNOSIS — Z8249 Family history of ischemic heart disease and other diseases of the circulatory system: Secondary | ICD-10-CM

## 2014-02-18 DIAGNOSIS — E7800 Pure hypercholesterolemia, unspecified: Secondary | ICD-10-CM

## 2014-02-18 DIAGNOSIS — Z139 Encounter for screening, unspecified: Secondary | ICD-10-CM

## 2014-02-18 DIAGNOSIS — E785 Hyperlipidemia, unspecified: Secondary | ICD-10-CM | POA: Insufficient documentation

## 2014-02-18 NOTE — Progress Notes (Signed)
Abdominal Aorta Duplex performed  

## 2014-02-18 NOTE — Progress Notes (Signed)
Exercise Treadmill Test  Pre-Exercise Testing Evaluation Rhythm: normal sinus  Rate: 61 bpm     Test  Exercise Tolerance Test Ordering MD: Sherren Mocha, MD  Interpreting MD: Richardson Dopp, PA-C  Unique Test No: 1  Treadmill:  1  Indication for ETT: family hx  Contraindication to ETT: No   Stress Modality: exercise - treadmill  Cardiac Imaging Performed: non   Protocol: standard Bruce - maximal  Max BP:  202/72  Max MPHR (bpm):  152 85% MPR (bpm):  129  MPHR obtained (bpm):  144 % MPHR obtained:  94  Reached 85% MPHR (min:sec):  8:18 Total Exercise Time (min-sec):  10:00  Workload in METS:  11.7 Borg Scale: 18  Reason ETT Terminated:  desired heart rate attained    ST Segment Analysis At Rest: normal ST segments - no evidence of significant ST depression With Exercise: non-specific ST changes  Other Information Arrhythmia:  1 ventricular couplet during exercise Angina during ETT:  absent (0) Quality of ETT:  diagnostic  ETT Interpretation:  normal - no evidence of ischemia by ST analysis  Comments: Good exercise capacity. No chest pain. Normal BP response to exercise. No significant ST changes to suggest ischemia.  There was insignificant up-sloping ST depression at peak exercise.  1 ventricular couplet during exercise.  Recommendations: FU with Dr. Sherren Mocha as planned. Signed,  Richardson Dopp, PA-C   02/18/2014 9:31 AM

## 2014-02-23 ENCOUNTER — Other Ambulatory Visit: Payer: Self-pay

## 2014-02-23 MED ORDER — SILDENAFIL CITRATE 100 MG PO TABS: 50.0000 mg | ORAL_TABLET | Freq: Every day | ORAL | Status: DC | PRN

## 2014-02-23 NOTE — Telephone Encounter (Signed)
Dr Elder Cyphers asked me to refill this for pt, and advised I should increase to #18 if less than that is on current Rx. Pt's Rx has been for #24 so left at that quantity w/ total RFs of 6.

## 2014-09-14 ENCOUNTER — Other Ambulatory Visit: Payer: Self-pay | Admitting: Internal Medicine

## 2014-11-05 ENCOUNTER — Other Ambulatory Visit: Payer: Self-pay | Admitting: Physician Assistant

## 2015-08-24 ENCOUNTER — Telehealth: Payer: Self-pay | Admitting: Gastroenterology

## 2015-08-24 NOTE — Telephone Encounter (Signed)
OK for switch.  No GI records in Epic so please find so I can review.

## 2015-08-25 NOTE — Telephone Encounter (Signed)
Requested warehouse chart for you to review. Nothing in [place].

## 2015-08-26 NOTE — Telephone Encounter (Signed)
Received chart from warehouse and on Dr. Lynne Leader desk for review

## 2015-09-28 ENCOUNTER — Encounter: Payer: Self-pay | Admitting: Gastroenterology

## 2015-10-21 ENCOUNTER — Ambulatory Visit (AMBULATORY_SURGERY_CENTER): Payer: Self-pay

## 2015-10-21 VITALS — Ht 74.0 in | Wt 195.0 lb

## 2015-10-21 DIAGNOSIS — Z1211 Encounter for screening for malignant neoplasm of colon: Secondary | ICD-10-CM

## 2015-10-21 MED ORDER — SUPREP BOWEL PREP KIT 17.5-3.13-1.6 GM/177ML PO SOLN: 1.0000 | Freq: Once | ORAL | 0 refills | Status: AC

## 2015-10-21 NOTE — Progress Notes (Signed)
No allergies to eggs or soy No diet meds No home oxygen No past problems with anesthesia  Has email and internet; declined emmi 

## 2015-11-15 ENCOUNTER — Encounter: Payer: Self-pay | Admitting: Gastroenterology

## 2015-11-15 ENCOUNTER — Ambulatory Visit (AMBULATORY_SURGERY_CENTER): Payer: Medicare Other | Admitting: Gastroenterology

## 2015-11-15 VITALS — BP 114/60 | HR 55 | Temp 97.5°F | Resp 20 | Ht 74.0 in | Wt 195.0 lb

## 2015-11-15 DIAGNOSIS — Z1211 Encounter for screening for malignant neoplasm of colon: Secondary | ICD-10-CM | POA: Diagnosis present

## 2015-11-15 DIAGNOSIS — D125 Benign neoplasm of sigmoid colon: Secondary | ICD-10-CM

## 2015-11-15 MED ORDER — SODIUM CHLORIDE 0.9 % IV SOLN: 500.0000 mL | INTRAVENOUS | Status: DC

## 2015-11-15 NOTE — Progress Notes (Signed)
Called to room to assist during endoscopic procedure.  Patient ID and intended procedure confirmed with present staff. Received instructions for my participation in the procedure from the performing physician.  

## 2015-11-15 NOTE — Patient Instructions (Signed)
YOU HAD AN ENDOSCOPIC PROCEDURE TODAY AT THE [facility]:   Refer to the procedure report that was given to you for any specific questions about what was found during the examination.  If the procedure report does not answer your questions, please call your gastroenterologist to clarify.  If you requested that your care partner not be given the details of your procedure findings, then the procedure report has been included in a sealed envelope for you to review at your convenience later.  YOU SHOULD EXPECT: Some feelings of bloating in the abdomen. Passage of more gas than usual.  Walking can help get rid of the air that was put into your GI tract during the procedure and reduce the bloating. If you had a lower endoscopy (such as a colonoscopy or flexible sigmoidoscopy) you may notice spotting of blood in your stool or on the toilet paper. If you underwent a bowel prep for your procedure, you may not have a normal bowel movement for a few days.  Please Note:  You might notice some irritation and congestion in your nose or some drainage.  This is from the oxygen used during your procedure.  There is no need for concern and it should clear up in a day or so.  SYMPTOMS TO REPORT IMMEDIATELY:   Following lower endoscopy (colonoscopy or flexible sigmoidoscopy):  Excessive amounts of blood in the stool  Significant tenderness or worsening of abdominal pains  Swelling of the abdomen that is new, acute  Fever of 100F or higher    For urgent or emergent issues, a gastroenterologist can be reached at any hour by calling [phone].   DIET:  We do recommend a small meal at first, but then you may proceed to your regular diet.  Drink plenty of fluids but you should avoid alcoholic beverages for 24 hours.  ACTIVITY:  You should plan to take it easy for the rest of today and you should NOT DRIVE or use heavy machinery until tomorrow (because of the sedation medicines used during the test).     FOLLOW UP: Our staff will call the number listed on your records the next business day following your procedure to check on you and address any questions or concerns that you may have regarding the information given to you following your procedure. If we do not reach you, we will leave a message.  However, if you are feeling well and you are not experiencing any problems, there is no need to return our call.  We will assume that you have returned to your regular daily activities without incident.  If any biopsies were taken you will be contacted by phone or by letter within the next 1-3 weeks.  Please call us at [phone] if you have not heard about the biopsies in 3 weeks.    SIGNATURES/CONFIDENTIALITY: You and/or your care partner have signed paperwork which will be entered into your electronic medical record.  These signatures attest to the fact that that the information above on your After Visit Summary has been reviewed and is understood.  Full responsibility of the confidentiality of this discharge information lies with you and/or your care-partner.   Resume medications. Information given on polyps. 

## 2015-11-15 NOTE — Progress Notes (Signed)
TO PACU  Awake and alert  Report to RN 

## 2015-11-15 NOTE — Op Note (Signed)
[facility] Patient Name: [name] Procedure Date: 11/15/2015 1:19 PM MRN: [id number] Endoscopist: Ladene Artist , MD Age: 70 Referring MD:  Date of Birth: 1945-07-17 Gender: Male Account #: [id number] Procedure:                Colonoscopy Indications:              Positive Cologuard test Medicines:                Monitored Anesthesia Care Procedure:                Pre-Anesthesia Assessment:                           - Prior to the procedure, a History and Physical                            was performed, and patient medications and                            allergies were reviewed. The patient's tolerance of                            previous anesthesia was also reviewed. The risks                            and benefits of the procedure and the sedation                            options and risks were discussed with the patient.                            All questions were answered, and informed consent                            was obtained. Prior Anticoagulants: The patient has                            taken no previous anticoagulant or antiplatelet                            agents. ASA Grade Assessment: II - A patient with                            mild systemic disease. After reviewing the risks                            and benefits, the patient was deemed in                            satisfactory condition to undergo the procedure.                           After obtaining informed consent, the colonoscope  was passed under direct vision. Throughout the                            procedure, the patient's blood pressure, pulse, and                            oxygen saturations were monitored continuously. The                            Model PCF-[id number]) scope was introduced                            through the anus and advanced to the the cecum,                            identified by appendiceal orifice  and ileocecal                            valve. The ileocecal valve, appendiceal orifice,                            and rectum were photographed. The quality of the                            bowel preparation was excellent. The colonoscopy                            was performed without difficulty. The patient                            tolerated the procedure well. Scope In: 1:51:56 PM Scope Out: 2:08:42 PM Scope Withdrawal Time: 0 hours 14 minutes 35 seconds  Total Procedure Duration: 0 hours 16 minutes 46 seconds  Findings:                 Two sessile polyps were found in the sigmoid colon.                            The polyps were 6 to 8 mm in size. These polyps                            were removed with a cold snare. Resection and                            retrieval were complete.                           The exam was otherwise without abnormality on                            direct and retroflexion views. Complications:            No immediate complications. Estimated blood loss:  None. Estimated Blood Loss:     Estimated blood loss: none. Impression:               - Two 6 to 8 mm polyps in the sigmoid colon,                            removed with a cold snare. Resected and retrieved.                           - The examination was otherwise normal on direct                            and retroflexion views. Recommendation:           - Repeat colonoscopy in 5 years for surveillance if                            polyps are precancerous, otherwise colonoscopy in10                            years for screening.                           - Patient has a contact number available for                            emergencies. The signs and symptoms of potential                            delayed complications were discussed with the                            patient. Return to normal activities tomorrow.                            Written discharge  instructions were provided to the                            patient.                           - Resume previous diet.                           - Continue present medications.                           - Await pathology results. Ladene Artist, MD 11/15/2015 2:16:52 PM This report has been signed electronically.

## 2015-11-16 ENCOUNTER — Telehealth: Payer: Self-pay

## 2015-11-16 NOTE — Telephone Encounter (Signed)
  Follow up Call-  Call back number 11/15/2015  Post procedure Call Back phone  # [phone]  Permission to leave phone message Yes  Some recent data might be hidden    Patient was called for follow up after his procedure on 11/15/2015. No answer at the number given for follow up phone call. A message was left on the answering machine.

## 2015-11-23 ENCOUNTER — Encounter: Payer: Self-pay | Admitting: Gastroenterology

## 2016-01-04 ENCOUNTER — Encounter: Payer: Self-pay | Admitting: Cardiovascular Disease

## 2016-01-19 ENCOUNTER — Ambulatory Visit (INDEPENDENT_AMBULATORY_CARE_PROVIDER_SITE_OTHER): Payer: Medicare Other | Admitting: Cardiovascular Disease

## 2016-01-19 ENCOUNTER — Encounter: Payer: Self-pay | Admitting: Cardiovascular Disease

## 2016-01-19 ENCOUNTER — Encounter (INDEPENDENT_AMBULATORY_CARE_PROVIDER_SITE_OTHER): Payer: Self-pay

## 2016-01-19 VITALS — BP 114/68 | HR 66 | Ht 74.0 in | Wt 195.4 lb

## 2016-01-19 DIAGNOSIS — E7800 Pure hypercholesterolemia, unspecified: Secondary | ICD-10-CM

## 2016-01-19 DIAGNOSIS — I700 Atherosclerosis of aorta: Secondary | ICD-10-CM

## 2016-01-19 DIAGNOSIS — Z8249 Family history of ischemic heart disease and other diseases of the circulatory system: Secondary | ICD-10-CM | POA: Diagnosis not present

## 2016-01-19 MED ORDER — SILDENAFIL CITRATE 20 MG PO TABS: ORAL_TABLET | ORAL | 1 refills | Status: DC

## 2016-01-19 NOTE — Progress Notes (Signed)
Cardiology Office Note Date:  01/21/2016   ID:  [name], DOB Dec 27, 1945, MRN [id number]  PCP:  No primary care provider on file.  Cardiologist:  Sherren Mocha, MD    Chief Complaint  Patient presents with  . Follow-up    History of Present Illness: [name] is a 70 y.o. male who presents for follow-up evaluation. The patient was initially seen in 2015 for a well visit in the setting of a very strong family history of atherosclerotic disease. There is a history of stroke, carotid endarterectomy, CABG, and extensive coronary artery disease in the patient's parents and siblings, all of them were nonsmokers. At the time of his initial evaluation he underwent a coronary CT for calcification and his coronary calcium score was 0. An exercise treadmill study was normal. 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  Labs reviewed from 01/06/2016 which demonstrate creatinine of 1.3, BUN 22, ALT 54, AST 35, cholesterol 176, HDL 42, LDL 75, triglycerides 293.  Today, he denies symptoms of palpitations, chest pain, shortness of breath, orthopnea, PND, lower extremity edema, dizziness, or syncope.   Past Medical History:  Diagnosis Date  . Abnormal MRI    BRAIN  . Anxiety   . HSV (herpes simplex virus) infection   . Hyperlipidemia   . Nephrolithiasis   . Weight loss     Past Surgical History:  Procedure Laterality Date  . URETERAL STENT PLACEMENT      Current Outpatient Prescriptions  Medication Sig Dispense Refill  . aspirin 81 MG tablet Take 81 mg by mouth daily.    Marland Kitchen atorvastatin (LIPITOR) 20 MG tablet TAKE 1 TABLET (20 MG TOTAL) BY MOUTH DAILY.  "NEEDS OV FOR REFILLS" 2ND 30 tablet 0  . tamsulosin (FLOMAX) 0.4 MG CAPS capsule Take 0.4 mg by mouth daily.     . valACYclovir (VALTREX) 1000 MG tablet Take 1 tablet (1,000 mg total) by mouth 3 (three) times daily. 30 tablet 0  .  sildenafil (REVATIO) 20 MG tablet Take 3-5 tablets by mouth as needed prior to sexual activity 50 tablet 1   Current Facility-Administered Medications  Medication Dose Route Frequency Provider Last Rate Last Dose  . 0.9 %  sodium chloride infusion  500 mL Intravenous Continuous Ladene Artist, MD        Allergies:   Patient has no known allergies.   Social History:  The patient  reports that he has never smoked. He has never used smokeless tobacco. He reports that he drinks about 8.4 oz of alcohol per week . He reports that he does not use drugs.   Family History:  The patient's  family history includes Breast cancer in his paternal grandmother; CAD in his mother; High blood pressure in his brother; Melanoma in his father; Stroke in his sister.    ROS:  Please see the history of present illness.  Otherwise, review of systems is positive for Hearing loss and back pain, snoring.  All other systems are reviewed and negative.    PHYSICAL EXAM: VS:  BP 114/68   Pulse 66   Ht 6\' 2"  (1.88 m)   Wt 195 lb 6.4 oz (88.6 kg)   BMI 25.09 kg/m  , BMI Body mass index is 25.09 kg/m. GEN: Well nourished, well developed, in no acute distress  HEENT: normal  Neck: no JVD, no masses. No carotid bruits Cardiac: RRR without murmur or gallop  Respiratory:  clear to auscultation bilaterally, normal work of breathing GI: soft, nontender, nondistended, + BS MS: no deformity or atrophy  Ext: no pretibial edema, pedal pulses 2+= bilaterally Skin: warm and dry, no rash Neuro:  Strength and sensation are intact Psych: euthymic mood, full affect  EKG:  EKG is ordered today. The ekg ordered today shows normal sinus rhythm 67 bpm, within normal limits.  Recent Labs: No results found for requested labs within last 8760 hours.   Lipid Panel     Component Value Date/Time   CHOL 148 06/26/2013 1018   TRIG 119 06/26/2013 1018   HDL 48 06/26/2013 1018   CHOLHDL 3.1 06/26/2013 1018   VLDL 24  06/26/2013 1018   LDLCALC 76 06/26/2013 1018      Wt Readings from Last 3 Encounters:  01/19/16 195 lb 6.4 oz (88.6 kg)  11/15/15 195 lb (88.5 kg)  10/21/15 195 lb (88.5 kg)     Cardiac Studies Reviewed: Coronary calcium scan: IMPRESSION: Coronary calcium score of 0. This was 0 percentile for age and sex matched control.  Abdominal Ultrasound: athero noted. No AAA or stenosis. No iliac stenosis.   ASSESSMENT AND PLAN: Hyperlipidemia: lipids evaluated from recent labs and at goal. Findings from 2015 studies are reassuring with atherosclerotic changes only noted in his abdominal aorta. Continue ASA and atorvastatin. Check screening carotid ultrasound. FU one year. Keep up the good work with regular exercise as he is doing.  Current medicines are reviewed with the patient today.  The patient does not have concerns regarding medicines.  Labs/ tests ordered today include:   Orders Placed This Encounter  Procedures  . EKG 12-Lead   Disposition:   FU one year  Signed, Sherren Mocha, MD  01/21/2016 6:00 AM    [facility] [place], [place], [place]  [address] Phone: [phone]; Fax: [phone]

## 2016-01-19 NOTE — Patient Instructions (Signed)
Medication Instructions:  Your physician recommends that you continue on your current medications as directed. Please refer to the Current Medication list given to you today.  Labwork: No new orders.   Testing/Procedures: Patient requested Vascuscreen (Carotid, Abdominal Aorta, ABI)--$125 out of pocket  Follow-Up: Your physician wants you to follow-up in: 1 YEAR with Dr Burt Knack. You will receive a reminder letter in the mail two months in advance. If you don't receive a letter, please call our office to schedule the follow-up appointment.    Any Other Special Instructions Will Be Listed Below (If Applicable).     If you need a refill on your cardiac medications before your next appointment, please call your pharmacy.

## 2016-02-23 ENCOUNTER — Other Ambulatory Visit: Payer: Self-pay | Admitting: Cardiovascular Disease

## 2016-02-23 ENCOUNTER — Ambulatory Visit (HOSPITAL_COMMUNITY)
Admission: RE | Admit: 2016-02-23 | Discharge: 2016-02-23 | Disposition: A | Discharge status: Home / Self Care | Payer: Medicare Other | Source: Ambulatory Visit | Attending: Cardiovascular Disease | Admitting: Cardiovascular Disease

## 2016-02-23 DIAGNOSIS — E7800 Pure hypercholesterolemia, unspecified: Secondary | ICD-10-CM

## 2016-02-23 DIAGNOSIS — Z8249 Family history of ischemic heart disease and other diseases of the circulatory system: Secondary | ICD-10-CM

## 2016-02-23 DIAGNOSIS — Z136 Encounter for screening for cardiovascular disorders: Secondary | ICD-10-CM

## 2016-02-23 DIAGNOSIS — I700 Atherosclerosis of aorta: Secondary | ICD-10-CM

## 2017-01-09 ENCOUNTER — Other Ambulatory Visit: Payer: Self-pay | Admitting: Cardiovascular Disease

## 2017-01-09 DIAGNOSIS — E7800 Pure hypercholesterolemia, unspecified: Secondary | ICD-10-CM

## 2017-01-09 DIAGNOSIS — I700 Atherosclerosis of aorta: Secondary | ICD-10-CM

## 2017-01-09 DIAGNOSIS — Z8249 Family history of ischemic heart disease and other diseases of the circulatory system: Secondary | ICD-10-CM

## 2017-01-09 NOTE — Telephone Encounter (Signed)
Pt's pharmacy is requesting a refill on sildenafil 20 mg tablet. Would you like to refill this medication? Please advise

## 2017-01-11 ENCOUNTER — Telehealth: Payer: Self-pay | Admitting: *Deleted

## 2017-01-11 NOTE — Telephone Encounter (Signed)
Received request for prior authorization for SILDENAFIL, completed forms through covermymeds and submitted.

## 2017-01-12 NOTE — Telephone Encounter (Signed)
Received a denial on the pts Sildenafil PA via fax from [place]. Reason: Drugs when used for the [place] of sexual dysfunction are excluded from coverage under Medicare rules.  I have faxed denial letter to [facility] and they will notify the pt.

## 2017-02-06 ENCOUNTER — Ambulatory Visit (INDEPENDENT_AMBULATORY_CARE_PROVIDER_SITE_OTHER): Payer: Medicare Other | Admitting: Physician Assistant

## 2017-02-06 ENCOUNTER — Encounter: Payer: Self-pay | Admitting: Physician Assistant

## 2017-02-06 VITALS — BP 112/70 | HR 66 | Ht 74.0 in | Wt 196.8 lb

## 2017-02-06 DIAGNOSIS — E7800 Pure hypercholesterolemia, unspecified: Secondary | ICD-10-CM | POA: Diagnosis not present

## 2017-02-06 DIAGNOSIS — Z8249 Family history of ischemic heart disease and other diseases of the circulatory system: Secondary | ICD-10-CM

## 2017-02-06 NOTE — Progress Notes (Signed)
Cardiology Office Note    Date:  02/06/2017   ID:  [name], DOB 10-10-1945, MRN [id number]  PCP:  Patient, No Pcp Per  Cardiologist: Dr. Burt Knack  Chief Complaint: Yearly follow up  History of Present Illness:   [name] is a 71 y.o. male with hx of CAD and strong family hx of cardiac disease as below presents for follow up.   The patient was initially seen in 2015 for a well visit in the setting of a very strong family history of atherosclerotic disease. There is a history of stroke, carotid endarterectomy, CABG, and extensive coronary artery disease in the patient's parents and siblings, all of them were nonsmokers. At the time of his initial evaluation he underwent a coronary CT for calcification and his coronary calcium score was 0. An exercise treadmill study was normal. 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  He was doing well when last seen by Dr. Burt Knack 01/2016. Normal vascular screening of carotid artery, peripheral artery and abdominal aneurysm 02/2016.  Here today for follow up.  He does exercise and weightlifting 4 times per week without any angina or dyspnea.  He denies orthopnea, PND, syncope, lower extremity edema, palpitations, dizziness or melena.  He lives 6-8 months [place] and other months in  [place].   Past Medical History:  Diagnosis Date  . Abnormal MRI    BRAIN  . Anxiety   . HSV (herpes simplex virus) infection   . Hyperlipidemia   . Nephrolithiasis   . Weight loss     Past Surgical History:  Procedure Laterality Date  . URETERAL STENT PLACEMENT      Current Medications: Prior to Admission medications   Medication Sig Start Date End Date Taking? Authorizing Provider  aspirin 81 MG tablet Take 81 mg by mouth daily.    [provider]  atorvastatin (LIPITOR) 20 MG tablet TAKE 1 TABLET (20 MG TOTAL) BY MOUTH DAILY.  "NEEDS OV FOR  REFILLS" 2ND 11/06/14   Harrison Mons, PA-C  sildenafil (REVATIO) 20 MG tablet TAKE 3 TO 5 TABLETS AS NEEDED PRIOR TO SEXUAL ACTIVITY 01/10/17   Sherren Mocha, MD  tamsulosin (FLOMAX) 0.4 MG CAPS capsule Take 0.4 mg by mouth daily.     [provider]  valACYclovir (VALTREX) 1000 MG tablet Take 1 tablet (1,000 mg total) by mouth 3 (three) times daily. 06/30/13   Orma Flaming, MD    Allergies:   Patient has no known allergies.   Social History   Socioeconomic History  . Marital status: Married    Spouse name: None  . Number of children: 2  . Years of education: None  . Highest education level: None  Social Needs  . Financial resource strain: None  . Food insecurity - worry: None  . Food insecurity - inability: None  . Transportation needs - medical: None  . Transportation needs - non-medical: None  Occupational History  . Occupation: RETIRED  Tobacco Use  . Smoking status: Never Smoker  . Smokeless tobacco: Never Used  Substance and Sexual Activity  . Alcohol use: Yes    Alcohol/week: 8.4 oz    Types: 7 Standard drinks or equivalent, 7 Cans of beer per week    Comment: one drink a day  . Drug use: No  . Sexual activity: Yes  Other Topics Concern  . None  Social History Narrative   SOME EXERCISE.  Family History:  The patient's family history includes Breast cancer in his paternal grandmother; CAD in his mother; High blood pressure in his brother; Melanoma in his father; Stroke in his sister.   ROS:   Please see the history of present illness.    ROS All other systems reviewed and are negative.   PHYSICAL EXAM:   VS:  BP 112/70   Pulse 66   Ht 6\' 2"  (1.88 m)   Wt 196 lb 12.8 oz (89.3 kg)   BMI 25.27 kg/m    GEN: Well nourished, well developed, in no acute distress  HEENT: normal  Neck: no JVD, carotid bruits, or masses Cardiac: RRR; no murmurs, rubs, or gallops,no edema  Respiratory:  clear to auscultation bilaterally, normal work of  breathing GI: soft, nontender, nondistended, + BS MS: no deformity or atrophy  Skin: warm and dry, no rash Neuro:  Alert and Oriented x 3, Strength and sensation are intact Psych: euthymic mood, full affect  Wt Readings from Last 3 Encounters:  02/06/17 196 lb 12.8 oz (89.3 kg)  01/19/16 195 lb 6.4 oz (88.6 kg)  11/15/15 195 lb (88.5 kg)      Studies/Labs Reviewed:   EKG:  EKG is ordered today.  The ekg ordered today demonstrates NSR  Recent Labs: No results found for requested labs within last 8760 hours.   Lipid Panel    Component Value Date/Time   CHOL 148 06/26/2013 1018   TRIG 119 06/26/2013 1018   HDL 48 06/26/2013 1018   CHOLHDL 3.1 06/26/2013 1018   VLDL 24 06/26/2013 1018   LDLCALC 76 06/26/2013 1018    Additional studies/ records that were reviewed today include:  Coronary calcium scan: IMPRESSION: Coronary calcium score of 0. This was 0 percentile for age and sex matched control.  Abdominal Ultrasound: athero noted. No AAA or stenosis. No iliac stenosis.   Normal vascular screening of carotid artery, peripheral artery and abdominal aneurysm 02/2016.   ASSESSMENT & PLAN:    1. HLD -Managed by PCP in [place].  He is scheduled to have a lab work next month.  He will fax all results here.  Continue Lipitor 20 mg daily.  2. Family hx of cardiac disease - Work as negative as described above    Medication Adjustments/Labs and Tests Ordered: Current medicines are reviewed at length with the patient today.  Concerns regarding medicines are outlined above.  Medication changes, Labs and Tests ordered today are listed in the Patient Instructions below. Patient Instructions  Medication Instructions:  Your physician recommends that you continue on your current medications as directed. Please refer to the Current Medication list given to you today.  * If you need a refill on your cardiac medications before your next appointment, please call your pharmacy.  *  Labwork: None ordered  Testing/Procedures: None ordered  Follow-Up: Your physician wants you to follow-up in: 1 year with Dr. Burt Knack.  You will receive a reminder letter in the mail two months in advance. If you don't receive a letter, please call our office to schedule the follow-up appointment.  Thank you for choosing [facility]!!             Jarrett Soho, [place]  02/06/2017 3:49 PM    [address], [place], [place]  [address] Phone: [phone]; Fax: [phone]

## 2017-02-06 NOTE — Patient Instructions (Signed)
Medication Instructions:  Your physician recommends that you continue on your current medications as directed. Please refer to the Current Medication list given to you today.  * If you need a refill on your cardiac medications before your next appointment, please call your pharmacy. *  Labwork: None ordered  Testing/Procedures: None ordered  Follow-Up: Your physician wants you to follow-up in: 1 year with Dr. Burt Knack.  You will receive a reminder letter in the mail two months in advance. If you don't receive a letter, please call our office to schedule the follow-up appointment.  Thank you for choosing [facility]!!

## 2017-08-20 ENCOUNTER — Encounter: Payer: Self-pay | Admitting: Cardiovascular Disease

## 2017-08-20 ENCOUNTER — Encounter (INDEPENDENT_AMBULATORY_CARE_PROVIDER_SITE_OTHER): Payer: Self-pay

## 2017-08-20 ENCOUNTER — Ambulatory Visit (INDEPENDENT_AMBULATORY_CARE_PROVIDER_SITE_OTHER): Payer: Medicare Other | Admitting: Cardiovascular Disease

## 2017-08-20 VITALS — BP 108/64 | HR 65 | Ht 74.0 in | Wt 190.0 lb

## 2017-08-20 DIAGNOSIS — E7800 Pure hypercholesterolemia, unspecified: Secondary | ICD-10-CM

## 2017-08-20 DIAGNOSIS — R0609 Other forms of dyspnea: Secondary | ICD-10-CM | POA: Diagnosis not present

## 2017-08-20 NOTE — Patient Instructions (Signed)
Medication Instructions:  Your provider recommends that you continue on your current medications as directed. Please refer to the Current Medication list given to you today.    Labwork: None  Testing/Procedures: Your provider has requested that you have an echocardiogram. Echocardiography is a painless test that uses sound waves to create images of your heart. It provides your doctor with information about the size and shape of your heart and how well your heart's chambers and valves are working. This procedure takes approximately one hour. There are no restrictions for this procedure.    Your provider has requested that you have an exercise tolerance test. For further information please visit [email or web address]. Please also follow instruction sheet, as given.   Follow-Up: Your provider wants you to follow-up in: 1 year with Dr. Burt Knack. You will receive a reminder letter in the mail two months in advance. If you don't receive a letter, please call our office to schedule the follow-up appointment.    Any Other Special Instructions Will Be Listed Below (If Applicable).     If you need a refill on your cardiac medications before your next appointment, please call your pharmacy.

## 2017-08-20 NOTE — Progress Notes (Signed)
Cardiology Office Note Date:  08/20/2017   ID:  [name], DOB 1945/06/27, MRN [id number]  PCP:  Patient, No Pcp Per  Cardiologist:  Sherren Mocha, MD    Chief Complaint  Patient presents with  . Shortness of Breath     History of Present Illness: [name] is a 72 y.o. male who presents for follow-up evaluation.  The patient was initially seen in 2015 for a well visit in the setting of a very strong family history of atherosclerotic disease. There is a history of stroke, carotid endarterectomy, CABG, and extensive coronary artery disease in the patient's parents and siblings, all of them were nonsmokers. At the time of his initial evaluation he underwent a coronary CT for calcification and his coronary calcium score was 0. An exercise treadmill study was normal. 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  He is here alone today. Works out 3 days/week. Mostly does upper body work on machines and some lower body as well. Works out about 30 minutes. He has a 'bad disk' in his back and this limits his walking.  He does complain of shortness of breath with walking upstairs.  He denies orthopnea or PND.  He denies chest pain or pressure.  He denies cough, fevers, or chills.  Shortness of breath is something new that he has noticed this year.   Past Medical History:  Diagnosis Date  . Abnormal MRI    BRAIN  . Acute renal insufficiency 02/09/2013  . Anxiety   . HSV (herpes simplex virus) infection   . Hyperlipidemia   . Kidney stones 02/09/2013  . Nephrolithiasis   . Weight loss     Past Surgical History:  Procedure Laterality Date  . URETERAL STENT PLACEMENT      Current Outpatient Medications  Medication Sig Dispense Refill  . aspirin 81 MG tablet Take 81 mg by mouth daily.    Marland Kitchen atorvastatin (LIPITOR) 20 MG tablet TAKE 1 TABLET (20 MG TOTAL) BY MOUTH DAILY.  "NEEDS OV FOR REFILLS"  2ND 30 tablet 0  . sildenafil (REVATIO) 20 MG tablet TAKE 3 TO 5 TABLETS AS NEEDED PRIOR TO SEXUAL ACTIVITY 50 tablet 0  . tamsulosin (FLOMAX) 0.4 MG CAPS capsule Take 0.4 mg by mouth daily.     . valACYclovir (VALTREX) 1000 MG tablet Take 1,000 mg by mouth as needed.     Current Facility-Administered Medications  Medication Dose Route Frequency Provider Last Rate Last Dose  . 0.9 %  sodium chloride infusion  500 mL Intravenous Continuous Ladene Artist, MD        Allergies:   Patient has no known allergies.   Social History:  The patient  reports that he has never smoked. He has never used smokeless tobacco. He reports that he drinks about 8.4 oz of alcohol per week. He reports that he does not use drugs.   Family History:  The patient's family history includes Breast cancer in his paternal grandmother; CAD in his mother; High blood pressure in his brother; Melanoma in his father; Stroke in his sister.    ROS:  Please see the history of present illness.  Otherwise, review of systems is positive for exertional dyspnea, back pain, snoring.  All other systems are reviewed and negative.    PHYSICAL EXAM: VS:  BP 108/64   Pulse 65   Ht 6\' 2"  (1.88 m)   Wt 190 lb (86.2 kg)  SpO2 97%   BMI 24.39 kg/m  , BMI Body mass index is 24.39 kg/m. GEN: Well nourished, well developed, in no acute distress  HEENT: normal  Neck: no JVD, no masses. No carotid bruits Cardiac: RRR without murmur or gallop     Respiratory:  clear to auscultation bilaterally, normal work of breathing GI: soft, nontender, nondistended, + BS MS: no deformity or atrophy  Ext: no pretibial edema, pedal pulses 2+= bilaterally Skin: warm and dry, no rash Neuro:  Strength and sensation are intact Psych: euthymic mood, full affect  EKG:  EKG is not ordered today. EKG 02-06-2017: NSR, within normal limits  Recent Labs: No results found for requested labs within last 8760 hours.   Lipid Panel     Component Value  Date/Time   CHOL 148 06/26/2013 1018   TRIG 119 06/26/2013 1018   HDL 48 06/26/2013 1018   CHOLHDL 3.1 06/26/2013 1018   VLDL 24 06/26/2013 1018   LDLCALC 76 06/26/2013 1018      Wt Readings from Last 3 Encounters:  08/20/17 190 lb (86.2 kg)  02/06/17 196 lb 12.8 oz (89.3 kg)  01/19/16 195 lb 6.4 oz (88.6 kg)     Cardiac Studies Reviewed: Cardiac CT for Calcium: IMPRESSION: Coronary calcium score of 0. This was 0 percentile for age and sex matched control.  ASSESSMENT AND PLAN: 1.  Hyperlipidemia: Treated with atorvastatin.  Will send for his most recent lipids.  He is followed by his primary care physician in [place].  2.  Shortness of breath: Will evaluate with an echocardiogram and exercise treadmill study.  His last stress testing was 4 years ago.  His physical exam is unremarkable with normal lung and cardiac exam.  Current medicines are reviewed with the patient today.  The patient does not have concerns regarding medicines.  Labs/ tests ordered today include:   Orders Placed This Encounter  Procedures  . EXERCISE TOLERANCE TEST (ETT)  . ECHOCARDIOGRAM COMPLETE    Disposition:   FU one year  Signed, Sherren Mocha, MD  08/20/2017 1:11 PM    [facility] [address], [place], [place]  [address] Phone: [phone]; Fax: [phone]

## 2017-08-25 ENCOUNTER — Ambulatory Visit: Payer: PRIVATE HEALTH INSURANCE | Admitting: Physician Assistant

## 2017-09-27 ENCOUNTER — Telehealth: Payer: Self-pay | Admitting: Cardiovascular Disease

## 2017-09-27 NOTE — Telephone Encounter (Signed)
Records received from [place] office. Placed in chart prep.

## 2017-11-27 ENCOUNTER — Ambulatory Visit (HOSPITAL_COMMUNITY): Payer: Medicare Other | Attending: Physician Assistant

## 2017-11-27 ENCOUNTER — Other Ambulatory Visit: Payer: Self-pay | Admitting: Cardiovascular Disease

## 2017-11-27 ENCOUNTER — Ambulatory Visit (INDEPENDENT_AMBULATORY_CARE_PROVIDER_SITE_OTHER): Payer: Medicare Other

## 2017-11-27 ENCOUNTER — Other Ambulatory Visit: Payer: Self-pay

## 2017-11-27 DIAGNOSIS — E785 Hyperlipidemia, unspecified: Secondary | ICD-10-CM

## 2017-11-27 DIAGNOSIS — I361 Nonrheumatic tricuspid (valve) insufficiency: Secondary | ICD-10-CM | POA: Diagnosis not present

## 2017-11-27 DIAGNOSIS — I10 Essential (primary) hypertension: Secondary | ICD-10-CM | POA: Diagnosis not present

## 2017-11-27 DIAGNOSIS — R0600 Dyspnea, unspecified: Secondary | ICD-10-CM | POA: Insufficient documentation

## 2017-11-27 DIAGNOSIS — Z8249 Family history of ischemic heart disease and other diseases of the circulatory system: Secondary | ICD-10-CM

## 2017-11-27 DIAGNOSIS — Z951 Presence of aortocoronary bypass graft: Secondary | ICD-10-CM | POA: Diagnosis not present

## 2017-11-27 DIAGNOSIS — Z8673 Personal history of transient ischemic attack (TIA), and cerebral infarction without residual deficits: Secondary | ICD-10-CM | POA: Insufficient documentation

## 2017-11-27 DIAGNOSIS — R0609 Other forms of dyspnea: Secondary | ICD-10-CM | POA: Insufficient documentation

## 2017-11-28 LAB — EXERCISE TOLERANCE TEST
CHL CUP MPHR: 148 {beats}/min
CSEPED: 11 min
CSEPEW: 13.7 METS
CSEPHR: 101 %
CSEPPHR: 150 {beats}/min
Exercise duration (sec): 26 s
RPE: 17
Rest HR: 64 {beats}/min

## 2018-08-14 ENCOUNTER — Telehealth: Payer: Self-pay | Admitting: *Deleted

## 2018-08-14 NOTE — Telephone Encounter (Signed)
Spoke with patient  consent to   IF USING DOXIMITY or [email or web address] - The patient will receive a link just prior to their visit by text. Confirm consent - "In the setting of the current Covid19 crisis, you are scheduled for a (phone or video) visit with your provider on (date) at (time).  Just as we do with many in-office visits, in order for you to participate in this visit, we must obtain consent.  If you'd like, I can send this to your mychart (if signed up) or email for you to review.  Otherwise, I can obtain your verbal consent now.  All virtual visits are billed to your insurance company just like a normal visit would be.  By agreeing to a virtual visit, we'd like you to understand that the technology does not allow for your provider to perform an examination, and thus may limit your provider's ability to fully assess your condition. If your provider identifies any concerns that need to be evaluated in person, we will make arrangements to do so.  Finally, though the technology is pretty good, we cannot assure that it will always work on either your or our end, and in the setting of a video visit, we may have to convert it to a phone-only visit.  In either situation, we cannot ensure that we have a secure connection.  Are you willing to proceed?" STAFF: Did the patient verbally acknowledge consent to telehealth visit? Document YES/NO here:  Yes    TELEPHONE CALL NOTE  [name] has been deemed a candidate for a follow-up tele-health visit to limit community exposure during the Covid-19 pandemic. I spoke with the patient via phone to ensure availability of phone/video source, confirm preferred email & phone number, and discuss instructions and expectations.  I reminded [name] to be prepared with any vital sign and/or heart rhythm information that could potentially be obtained via home monitoring, at the time of his visit. I reminded [name] to expect a phone call prior to  his visit.  Claude Manges, [place] 08/14/2018 1:30 PM         FULL LENGTH CONSENT FOR TELE-HEALTH VISIT   I hereby voluntarily request, consent and authorize [facility] and its employed or contracted physicians, physician assistants, nurse practitioners or other licensed health care professionals (the Practitioner), to provide me with telemedicine health care services (the Services") as deemed necessary by the treating Practitioner. I acknowledge and consent to receive the Services by the Practitioner via telemedicine. I understand that the telemedicine visit will involve communicating with the Practitioner through live audiovisual communication technology and the disclosure of certain medical information by electronic transmission. I acknowledge that I have been given the opportunity to request an in-person assessment or other available alternative prior to the telemedicine visit and am voluntarily participating in the telemedicine visit.  I understand that I have the right to withhold or withdraw my consent to the use of telemedicine in the course of my care at any time, without affecting my right to future care or treatment, and that the Practitioner or I may terminate the telemedicine visit at any time. I understand that I have the right to inspect all information obtained and/or recorded in the course of the telemedicine visit and may receive copies of available information for a reasonable fee.  I understand that some of the potential risks of receiving the Services via telemedicine include:   Delay or interruption in medical evaluation due to  technological equipment failure or disruption;  Information transmitted may not be sufficient (e.g. poor resolution of images) to allow for appropriate medical decision making by the Practitioner; and/or   In rare instances, security protocols could fail, causing a breach of personal health information.  Furthermore, I acknowledge that it is my  responsibility to provide information about my medical history, conditions and care that is complete and accurate to the best of my ability. I acknowledge that Practitioner's advice, recommendations, and/or decision may be based on factors not within their control, such as incomplete or inaccurate data provided by me or distortions of diagnostic images or specimens that may result from electronic transmissions. I understand that the practice of medicine is not an exact science and that Practitioner makes no warranties or guarantees regarding treatment outcomes. I acknowledge that I will receive a copy of this consent concurrently upon execution via email to the email address I last provided but may also request a printed copy by calling the office of [place].    I understand that my insurance will be billed for this visit.   I have read or had this consent read to me.  I understand the contents of this consent, which adequately explains the benefits and risks of the Services being provided via telemedicine.   I have been provided ample opportunity to ask questions regarding this consent and the Services and have had my questions answered to my satisfaction.  I give my informed consent for the services to be provided through the use of telemedicine in my medical care  By participating in this telemedicine visit I agree to the above.

## 2018-08-19 DIAGNOSIS — Z8249 Family history of ischemic heart disease and other diseases of the circulatory system: Secondary | ICD-10-CM | POA: Insufficient documentation

## 2018-08-19 DIAGNOSIS — E785 Hyperlipidemia, unspecified: Secondary | ICD-10-CM | POA: Insufficient documentation

## 2018-08-19 NOTE — Progress Notes (Signed)
Virtual Visit via Video Note   This visit type was conducted due to national recommendations for restrictions regarding the COVID-19 Pandemic (e.g. social distancing) in an effort to limit this patient's exposure and mitigate transmission in our community.  Due to his co-morbid illnesses, this patient is at least at moderate risk for complications without adequate follow up.  This format is felt to be most appropriate for this patient at this time.  All issues noted in this document were discussed and addressed.  A limited physical exam was performed with this format.  Please refer to the patient's chart for his consent to telehealth for [facility].   Date:  08/20/2018   ID:  EMILY FORSE, DOB 1945-04-02, MRN [id number]  Patient Location: Home Provider Location: Home  PCP:  Patient, No Pcp Per  Cardiologist:  Sherren Mocha, MD   Electrophysiologist:  None   Evaluation Performed:  Follow-Up Visit  Chief Complaint: Follow-up on family history of CAD, hyperlipidemia  History of Present Illness:    [name] is a 73 y.o. 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 Burt Knack in June 2019.  Today, he notes he is doing well.  He is back in [place].  However, when he was in [place] several weeks ago, he was riding his bike several miles a day.  He has not had any exertional chest discomfort or significant shortness of breath.  He has not had any syncope, significant swelling or bleeding issues.  He does get some dizziness when he takes Flomax.  He can generally cut the dose in half to improve his symptoms.  He does not have a blood pressure cuff.  However, when he checks his blood pressure at his PCPs office, it is optimal.  The patient does not have symptoms  concerning for COVID-19 infection (fever, chills, cough, or new shortness of breath).    Past Medical History:  Diagnosis Date  . Abnormal MRI    BRAIN  . Acute renal insufficiency 02/09/2013  . Anxiety   . HSV (herpes simplex virus) infection   . Hyperlipidemia   . Kidney stones 02/09/2013  . Nephrolithiasis   . Weight loss    Past Surgical History:  Procedure Laterality Date  . URETERAL STENT PLACEMENT       Current Meds  Medication Sig  . aspirin 81 MG tablet Take 81 mg by mouth daily.  Marland Kitchen atorvastatin (LIPITOR) 20 MG tablet TAKE 1 TABLET (20 MG TOTAL) BY MOUTH DAILY.  "NEEDS OV FOR REFILLS" 2ND (Patient taking differently: Take 10 mg by mouth. TAKE 1 TABLET (20 MG TOTAL) BY MOUTH DAILY.  "NEEDS OV FOR REFILLS" 2ND)  . sildenafil (REVATIO) 20 MG tablet TAKE 3 TO 5 TABLETS AS NEEDED PRIOR TO SEXUAL ACTIVITY  . tamsulosin (FLOMAX) 0.4 MG CAPS capsule Take 0.4 mg by mouth daily.   . valACYclovir (VALTREX) 1000 MG tablet Take 1,000 mg by mouth as needed.   Current Facility-Administered Medications for the 08/20/18 encounter (Telemedicine) with Richardson Dopp T, PA-C  Medication  . 0.9 %  sodium chloride infusion     Allergies:   Patient has no known allergies.   Social History   Tobacco Use  . Smoking status: Never Smoker  . Smokeless tobacco: Never Used  Substance Use  Topics  . Alcohol use: Yes    Alcohol/week: 14.0 standard drinks    Types: 7 Standard drinks or equivalent, 7 Cans of beer per week    Comment: one drink a day  . Drug use: No     Family Hx: The patient's family history includes Breast cancer in his paternal grandmother; CAD in his mother; High blood pressure in his brother; Melanoma in his father; Stroke in his sister. There is no history of Colon cancer.  ROS:   Please see the history of present illness.    All other systems reviewed and are negative.   Prior CV studies:   The following studies were reviewed today:  Exercise treadmill test  11/27/2017 Normal study  Echocardiogram 11/27/2017 EF 60-65, normal wall motion, trivial MR, mild TR  Vascular screening 02/23/2016 Carotid arteries normal bilaterally Normal ABIs bilaterally Normal abdominal aorta  Cardiac CT 02/18/2014 IMPRESSION: Coronary calcium score of 0. This was 0 percentile for age and sex matched control.  Labs/Other Tests and Data Reviewed:    EKG:  No ECG reviewed.  Recent Labs: No results found for requested labs within last 8760 hours.   Recent Lipid Panel Lab Results  Component Value Date/Time   CHOL 148 06/26/2013 10:18 AM   TRIG 119 06/26/2013 10:18 AM   HDL 48 06/26/2013 10:18 AM   CHOLHDL 3.1 06/26/2013 10:18 AM   LDLCALC 76 06/26/2013 10:18 AM    Wt Readings from Last 3 Encounters:  08/20/18 188 lb (85.3 kg)  08/20/17 190 lb (86.2 kg)  02/06/17 196 lb 12.8 oz (89.3 kg)     Objective:    Vital Signs:  Ht 6\' 2"  (1.88 m)   Wt 188 lb (85.3 kg)   BMI 24.14 kg/m    VITAL SIGNS:  reviewed GEN:  no acute distress EYES:  Sclera anicteric RESPIRATORY:  Normal respiratory effort NEURO:  Alert and oriented PSYCH:  normal affect  ASSESSMENT & PLAN:     Family history of premature CAD - Plan: Calcium score in December 2015 was 0.  Exercise treadmill test in September 2019 was low risk.  He is not having any anginal symptoms.  Continue aspirin, statin.  Hyperlipidemia, unspecified hyperlipidemia type - Plan: He will request that the most recent lipid panel drawn by his PCP in [place] to be faxed to our office for review.  Continue statin therapy.  COVID-19 Education: The signs and symptoms of COVID-19 were discussed with the patient and how to seek care for testing (follow up with PCP or arrange E-visit).  The importance of social distancing was discussed today.  Time:   Today, I have spent 9 minutes with the patient with telehealth technology discussing the above problems.     Medication Adjustments/Labs and Tests Ordered:  Current medicines are reviewed at length with the patient today.  Concerns regarding medicines are outlined above.   Tests Ordered: No orders of the defined types were placed in this encounter.   Medication Changes: No orders of the defined types were placed in this encounter.   Follow Up:  In Person in 1 year(s)  Signed, Richardson Dopp, PA-C  08/20/2018 10:31 AM    [place]

## 2018-08-20 ENCOUNTER — Telehealth (INDEPENDENT_AMBULATORY_CARE_PROVIDER_SITE_OTHER): Payer: Medicare Other | Admitting: Physician Assistant

## 2018-08-20 ENCOUNTER — Other Ambulatory Visit: Payer: Self-pay

## 2018-08-20 ENCOUNTER — Encounter: Payer: Self-pay | Admitting: Physician Assistant

## 2018-08-20 VITALS — Ht 74.0 in | Wt 188.0 lb

## 2018-08-20 DIAGNOSIS — R42 Dizziness and giddiness: Secondary | ICD-10-CM | POA: Diagnosis not present

## 2018-08-20 DIAGNOSIS — Z8249 Family history of ischemic heart disease and other diseases of the circulatory system: Secondary | ICD-10-CM | POA: Diagnosis not present

## 2018-08-20 DIAGNOSIS — E785 Hyperlipidemia, unspecified: Secondary | ICD-10-CM | POA: Diagnosis not present

## 2018-08-20 DIAGNOSIS — Z7189 Other specified counseling: Secondary | ICD-10-CM

## 2018-08-20 NOTE — Patient Instructions (Signed)
Medication Instructions:  Your physician recommends that you continue on your current medications as directed. Please refer to the Current Medication list given to you today.  If you need a refill on your cardiac medications before your next appointment, please call your pharmacy.   Lab work: NONE ORDERED  TODAY   If you have labs (blood work) drawn today and your tests are completely normal, you will receive your results only by: Marland Kitchen MyChart Message (if you have MyChart) OR . A paper copy in the mail If you have any lab test that is abnormal or we need to change your treatment, we will call you to review the results.  Testing/Procedures: NONE ORDERED  TODAY    Follow-Up: At [facility], you and your health needs are our priority.  As part of our continuing mission to provide you with exceptional heart care, we have created designated Provider Care Teams.  These Care Teams include your primary Cardiologist (physician) and Advanced Practice Providers (APPs -  Physician Assistants and Nurse Practitioners) who all work together to provide you with the care you need, when you need it. You will need a follow up appointment in:  1 years.  Please call our office 2 months in advance to schedule this appointment.  You may see Sherren Mocha, MD or one of the following Advanced Practice Providers on your designated Care Team: Richardson Dopp, PA-C [place], [place] . Daune Perch, NP  Any Other Special Instructions Will Be Listed Below (If Applicable).

## 2019-12-01 ENCOUNTER — Other Ambulatory Visit: Payer: Self-pay

## 2019-12-01 ENCOUNTER — Encounter: Payer: Self-pay | Admitting: Cardiovascular Disease

## 2019-12-01 ENCOUNTER — Ambulatory Visit (INDEPENDENT_AMBULATORY_CARE_PROVIDER_SITE_OTHER): Payer: Medicare Other | Admitting: Cardiovascular Disease

## 2019-12-01 VITALS — BP 90/62 | HR 78 | Ht 74.0 in | Wt 182.8 lb

## 2019-12-01 DIAGNOSIS — E782 Mixed hyperlipidemia: Secondary | ICD-10-CM | POA: Diagnosis not present

## 2019-12-01 DIAGNOSIS — Z8249 Family history of ischemic heart disease and other diseases of the circulatory system: Secondary | ICD-10-CM | POA: Diagnosis not present

## 2019-12-01 NOTE — Progress Notes (Signed)
Cardiology Office Note:    Date:  12/01/2019   ID:  [name], DOB 1945/09/25, MRN [id number]  PCP:  Patient, No Pcp Per  [place] Cardiologist:  Sherren Mocha, MD  [place] Electrophysiologist:  None   Referring MD: No ref. provider found   Chief Complaint  Patient presents with  . Hyperlipidemia    History of Present Illness:    [name] is a 74 y.o. male with a hx of hyperlipidemia and strong family history of coronary artery disease, presenting for follow-up evaluation.  Previous testing includes a CT coronary calcium score in 2015 demonstrating a calcium score of 0.  And exercise treadmill study in September 2019 was negative for ischemia and demonstrated normal blood pressure response to exercise.  An echocardiogram in 2019 showed normal LV function with no significant valvular disease.  The patient is here alone today.  He is doing very well.  He works out at [employer] 3 days/week with no exertional symptoms.  He specifically denies chest pain, chest pressure, or shortness of breath.  He notices that his blood pressure is generally low and he drinks lots of fluids.  He has been on tamsulosin for at least a few years.  He only is dizzy when he drives on a curvy road.  He otherwise has no significant symptoms at this time.  Past Medical History:  Diagnosis Date  . Abnormal MRI    BRAIN  . Acute renal insufficiency 02/09/2013  . Anxiety   . HSV (herpes simplex virus) infection   . Hyperlipidemia   . Kidney stones 02/09/2013  . Nephrolithiasis   . Weight loss     Past Surgical History:  Procedure Laterality Date  . URETERAL STENT PLACEMENT      Current Medications: Current Meds  Medication Sig  . aspirin 81 MG tablet Take 81 mg by mouth daily.  Marland Kitchen atorvastatin (LIPITOR) 20 MG tablet Take 10 mg by mouth daily.  . sildenafil (REVATIO) 20 MG tablet TAKE 3 TO 5 TABLETS AS NEEDED PRIOR TO SEXUAL ACTIVITY  . tamsulosin (FLOMAX) 0.4 MG CAPS capsule  Take 0.4 mg by mouth daily.   . valACYclovir (VALTREX) 1000 MG tablet Take 1,000 mg by mouth as needed.   Current Facility-Administered Medications for the 12/01/19 encounter (Office Visit) with Sherren Mocha, MD  Medication  . 0.9 %  sodium chloride infusion     Allergies:   Patient has no known allergies.   Social History   Socioeconomic History  . Marital status: Married    Spouse name: Not on file  . Number of children: 2  . Years of education: Not on file  . Highest education level: Not on file  Occupational History  . Occupation: RETIRED  Tobacco Use  . Smoking status: Never Smoker  . Smokeless tobacco: Never Used  Substance and Sexual Activity  . Alcohol use: Yes    Alcohol/week: 14.0 standard drinks    Types: 7 Standard drinks or equivalent, 7 Cans of beer per week    Comment: one drink a day  . Drug use: No  . Sexual activity: Yes  Other Topics Concern  . Not on file  Social History Narrative   SOME EXERCISE.   Social Determinants of Health   Financial Resource Strain:   . Difficulty of Paying Living Expenses: Not on file  Food Insecurity:   . Worried About Charity fundraiser in the Last Year: Not on file  . Ran Out of  Food in the Last Year: Not on file  Transportation Needs:   . Lack of Transportation (Medical): Not on file  . Lack of Transportation (Non-Medical): Not on file  Physical Activity:   . Days of Exercise per Week: Not on file  . Minutes of Exercise per Session: Not on file  Stress:   . Feeling of Stress : Not on file  Social Connections:   . Frequency of Communication with Friends and Family: Not on file  . Frequency of Social Gatherings with Friends and Family: Not on file  . Attends Religious Services: Not on file  . Active Member of Clubs or Organizations: Not on file  . Attends Archivist Meetings: Not on file  . Marital Status: Not on file     Family History: The patient's family history includes Breast cancer in his  paternal grandmother; CAD in his mother; High blood pressure in his brother; Melanoma in his father; Stroke in his sister. There is no history of Colon cancer.  ROS:   Please see the history of present illness.    All other systems reviewed and are negative.  EKGs/Labs/Other Studies Reviewed:    The following studies were reviewed today: Exercise tolerance test 11/27/2017: Study Highlights   Blood pressure demonstrated a normal response to exercise.  There was no ST segment deviation noted during stress.  No T wave inversion was noted during stress.  Excellent exercise capacity.  Negative, adequate stress test.  Echocardiogram 11/27/2017: Study Conclusions   - Left ventricle: The cavity size was normal. Systolic function was  normal. The estimated ejection fraction was in the range of 60%  to 65%. Wall motion was normal; there were no regional wall  motion abnormalities. Left ventricular diastolic function  parameters were normal.  - Aortic valve: There was no regurgitation.  - Mitral valve: There was trivial regurgitation.  - Right ventricle: Systolic function was normal.  - Tricuspid valve: There was mild regurgitation.  - Pulmonary arteries: Systolic pressure was within the normal  range.  - Inferior vena cava: The vessel was normal in size. The  respirophasic diameter changes were in the normal range (= 50%),  consistent with normal central venous pressure.   EKG:  EKG is ordered today.  The ekg ordered today demonstrates NSR, within normal limits, HR 78 bpm  Recent Labs: No results found for requested labs within last 8760 hours.  Recent Lipid Panel    Component Value Date/Time   CHOL 148 06/26/2013 1018   TRIG 119 06/26/2013 1018   HDL 48 06/26/2013 1018   CHOLHDL 3.1 06/26/2013 1018   VLDL 24 06/26/2013 1018   LDLCALC 76 06/26/2013 1018    Physical Exam:    VS:  BP 90/62   Pulse 78   Ht 6\' 2"  (1.88 m)   Wt 182 lb 12.8 oz (82.9 kg)    SpO2 98%   BMI 23.47 kg/m     Wt Readings from Last 3 Encounters:  12/01/19 182 lb 12.8 oz (82.9 kg)  08/20/18 188 lb (85.3 kg)  08/20/17 190 lb (86.2 kg)     GEN:  Well nourished, well developed in no acute distress HEENT: Normal NECK: No JVD; No carotid bruits LYMPHATICS: No lymphadenopathy CARDIAC: RRR, no murmurs, rubs, gallops RESPIRATORY:  Clear to auscultation without rales, wheezing or rhonchi  ABDOMEN: Soft, non-tender, non-distended MUSCULOSKELETAL:  No edema; No deformity  SKIN: Warm and dry NEUROLOGIC:  Alert and oriented x 3 PSYCHIATRIC:  Normal affect  ASSESSMENT:    1. Mixed hyperlipidemia    PLAN:    In order of problems listed above:  1. The patient is treated with atorvastatin 20 mg daily.  His primary care physician is in [place] and he follows his labs.  We will send for copies of his lipids and LFTs.  He follows a healthy lifestyle.  With his strong family history of stroke and MI, will continue with aggressive screening.  He will have a vascular screen of the carotid arteries, abdominal aorta, and ABIs.  He will have a coronary CT calcium score performed.  He should remain on aspirin and atorvastatin.  Otherwise advised him to liberalize salt with his low blood pressure and continue his healthy lifestyle.   Medication Adjustments/Labs and Tests Ordered: Current medicines are reviewed at length with the patient today.  Concerns regarding medicines are outlined above.  No orders of the defined types were placed in this encounter.  No orders of the defined types were placed in this encounter.   There are no Patient Instructions on file for this visit.   Signed, Sherren Mocha, MD  12/01/2019 10:20 AM    [place]

## 2019-12-01 NOTE — Patient Instructions (Signed)
Medication Instructions:  Your provider recommends that you continue on your current medications as directed. Please refer to the Current Medication list given to you today.   *If you need a refill on your cardiac medications before your next appointment, please call your pharmacy*  Testing/Procedures: Dr. Burt Knack recommends you have a CT CALCIUM SCORE.  Dr. Burt Knack recommends you have a VASCUSCREEN. Your physician has requested that you have an ankle brachial index (ABI). During this test an ultrasound and blood pressure cuff are used to evaluate the arteries that supply the arms and legs with blood. Allow thirty minutes for this exam. There are no restrictions or special instructions. Your physician has requested that you have a carotid duplex. This test is an ultrasound of the carotid arteries in your neck. It looks at blood flow through these arteries that supply the brain with blood. Allow one hour for this exam. There are no restrictions or special instructions. Your physician has requested that you have an abdominal aorta duplex. During this test, an ultrasound is used to evaluate the aorta. Allow 30 minutes for this exam. Do not eat after midnight the day before and avoid carbonated beverages  Follow-Up: At [facility], you and your health needs are our priority.  As part of our continuing mission to provide you with exceptional heart care, we have created designated Provider Care Teams.  These Care Teams include your primary Cardiologist (physician) and Advanced Practice Providers (APPs -  Physician Assistants and Nurse Practitioners) who all work together to provide you with the care you need, when you need it. Your next appointment:   12 month(s) The format for your next appointment:   In Person Provider:   You may see Sherren Mocha, MD or one of the following Advanced Practice Providers on your designated Care Team:    Richardson Dopp, PA-C  [place], [place]

## 2019-12-23 ENCOUNTER — Other Ambulatory Visit: Payer: Self-pay

## 2019-12-23 ENCOUNTER — Ambulatory Visit (INDEPENDENT_AMBULATORY_CARE_PROVIDER_SITE_OTHER)
Admission: RE | Admit: 2019-12-23 | Discharge: 2019-12-23 | Disposition: A | Discharge status: Home / Self Care | Payer: Self-pay | Source: Ambulatory Visit | Attending: Cardiovascular Disease | Admitting: Cardiovascular Disease

## 2019-12-23 DIAGNOSIS — Z8249 Family history of ischemic heart disease and other diseases of the circulatory system: Secondary | ICD-10-CM

## 2019-12-23 DIAGNOSIS — E782 Mixed hyperlipidemia: Secondary | ICD-10-CM

## 2019-12-24 ENCOUNTER — Ambulatory Visit (HOSPITAL_COMMUNITY)
Admission: RE | Admit: 2019-12-24 | Discharge: 2019-12-24 | Disposition: A | Discharge status: Home / Self Care | Payer: Medicare Other | Source: Ambulatory Visit | Attending: Cardiovascular Disease | Admitting: Cardiovascular Disease

## 2019-12-24 DIAGNOSIS — E782 Mixed hyperlipidemia: Secondary | ICD-10-CM

## 2019-12-24 DIAGNOSIS — Z8249 Family history of ischemic heart disease and other diseases of the circulatory system: Secondary | ICD-10-CM

## 2020-11-01 ENCOUNTER — Telehealth: Payer: Self-pay | Admitting: Cardiovascular Disease

## 2020-11-01 DIAGNOSIS — E782 Mixed hyperlipidemia: Secondary | ICD-10-CM

## 2020-11-01 DIAGNOSIS — Z823 Family history of stroke: Secondary | ICD-10-CM

## 2020-11-01 DIAGNOSIS — E785 Hyperlipidemia, unspecified: Secondary | ICD-10-CM

## 2020-11-01 DIAGNOSIS — Z8249 Family history of ischemic heart disease and other diseases of the circulatory system: Secondary | ICD-10-CM

## 2020-11-01 NOTE — Telephone Encounter (Signed)
Order for vascuscreen placed with a note to do carotids, abdominal aorta, and ABI's same time, as advised by Dr. Burt Knack.  Will send a message to our [facility] Schedulers and [place], to call the pt back and arrange this.  Pt made aware of plan and agrees with this.

## 2020-11-01 NOTE — Telephone Encounter (Signed)
Ok to order vascular screening exam which looks at carotid arteries, abdominal aorta to rule out aneurysm, and ABI's to rule out PAD. This exam is offered in our [place] lab. thanks

## 2020-11-01 NOTE — Telephone Encounter (Signed)
Pt is calling to request orders for "vascular screening" from Dr. Burt Knack, based on recent family history of CVA. Pt states his sister had a CVA last year, and his brother had one over 2 weeks ago, and is currently in a nursing facility for rehab of this.  Pt states Dr. Burt Knack ordered a vascuscreen on him in the past. Pt states he wants to be as pro-active as possible with his own health, given his strong family history of CVA.  Informed the pt that I will route this request to Dr. Burt Knack to further review and advise on, and triage nursing will follow-up with the pt accordingly thereafter.  Pt verbalized understanding and agrees with this plan.

## 2020-11-01 NOTE — Telephone Encounter (Signed)
Patient would like Dr. Burt Knack to order him a vascular study to rule out potential strokes. Both his younger sister and younger brother have had a stroke and are currently in a [place]. The patient is trying to be proactive

## 2020-11-02 NOTE — Telephone Encounter (Signed)
Pt is scheduled for his vascuscreen on 9/9 at 0900. Pt made aware of appt date and time by PV Scheduler Parks Neptune.

## 2020-11-12 ENCOUNTER — Other Ambulatory Visit: Payer: Self-pay

## 2020-11-12 ENCOUNTER — Ambulatory Visit (HOSPITAL_COMMUNITY)
Admission: RE | Admit: 2020-11-12 | Discharge: 2020-11-12 | Disposition: A | Discharge status: Home / Self Care | Payer: Medicare Other | Source: Ambulatory Visit | Attending: Cardiovascular Disease | Admitting: Cardiovascular Disease

## 2020-11-12 DIAGNOSIS — Z823 Family history of stroke: Secondary | ICD-10-CM

## 2020-11-12 DIAGNOSIS — E785 Hyperlipidemia, unspecified: Secondary | ICD-10-CM

## 2020-11-12 DIAGNOSIS — Z8249 Family history of ischemic heart disease and other diseases of the circulatory system: Secondary | ICD-10-CM

## 2020-11-12 DIAGNOSIS — E782 Mixed hyperlipidemia: Secondary | ICD-10-CM

## 2020-11-16 ENCOUNTER — Encounter: Payer: Self-pay | Admitting: Gastroenterology

## 2020-12-10 ENCOUNTER — Other Ambulatory Visit: Payer: Self-pay

## 2020-12-10 ENCOUNTER — Ambulatory Visit (INDEPENDENT_AMBULATORY_CARE_PROVIDER_SITE_OTHER): Payer: Medicare Other | Admitting: Cardiovascular Disease

## 2020-12-10 ENCOUNTER — Encounter: Payer: Self-pay | Admitting: Cardiovascular Disease

## 2020-12-10 VITALS — BP 128/84 | HR 69 | Ht 74.0 in | Wt 186.8 lb

## 2020-12-10 DIAGNOSIS — E785 Hyperlipidemia, unspecified: Secondary | ICD-10-CM

## 2020-12-10 DIAGNOSIS — E782 Mixed hyperlipidemia: Secondary | ICD-10-CM | POA: Diagnosis not present

## 2020-12-10 DIAGNOSIS — Z8249 Family history of ischemic heart disease and other diseases of the circulatory system: Secondary | ICD-10-CM

## 2020-12-10 NOTE — Progress Notes (Signed)
Cardiology Office Note:    Date:  12/10/2020   ID:  [name], DOB 1945-10-04, MRN [id number]  PCP:  Kathyrn Lass   [facility] Providers Cardiologist:  Sherren Mocha, MD     Referring MD: No ref. provider found   Chief Complaint  Patient presents with   Hyperlipidemia    History of Present Illness:    [name] is a 75 y.o. male with a hx of hyperlipidemia and strong family history of coronary artery disease, presenting for follow-up evaluation.  Previous testing includes a CT coronary calcium score in 2015 demonstrating a calcium score of 0.  And exercise treadmill study in September 2019 was negative for ischemia and demonstrated normal blood pressure response to exercise.  An echocardiogram in 2019 showed normal LV function with no significant valvular disease.  The patient is here alone today.  He feels well with regular physical activity and no exertional symptoms.  He specifically denies chest pain, shortness of breath, or heart palpitations.  He spends the majority of the year in [place] and is here in [place] for about 4 months/year.  The patient is compliant with his medicines.  He recently underwent a vascular screening evaluation showing nonobstructive carotid plaquing with no stenosis, normal ABIs, and no evidence of aortic aneurysm.  Past Medical History:  Diagnosis Date   Abnormal MRI    BRAIN   Acute renal insufficiency 02/09/2013   Anxiety    HSV (herpes simplex virus) infection    Hyperlipidemia    Kidney stones 02/09/2013   Nephrolithiasis    Weight loss     Past Surgical History:  Procedure Laterality Date   URETERAL STENT PLACEMENT      Current Medications: Current Meds  Medication Sig   aspirin 81 MG tablet Take 81 mg by mouth daily.   atorvastatin (LIPITOR) 20 MG tablet Take 10 mg by mouth daily.   sildenafil (REVATIO) 20 MG tablet TAKE 3 TO 5 TABLETS AS NEEDED PRIOR TO SEXUAL ACTIVITY   tamsulosin (FLOMAX) 0.4 MG CAPS capsule  Take 0.4 mg by mouth daily.    valACYclovir (VALTREX) 1000 MG tablet Take 1,000 mg by mouth as needed.   Current Facility-Administered Medications for the 12/10/20 encounter (Office Visit) with Sherren Mocha, MD  Medication   0.9 %  sodium chloride infusion     Allergies:   Patient has no known allergies.   Social History   Socioeconomic History   Marital status: Married    Spouse name: Not on file   Number of children: 2   Years of education: Not on file   Highest education level: Not on file  Occupational History   Occupation: RETIRED  Tobacco Use   Smoking status: Never   Smokeless tobacco: Never  Substance and Sexual Activity   Alcohol use: Yes    Alcohol/week: 14.0 standard drinks    Types: 7 Standard drinks or equivalent, 7 Cans of beer per week    Comment: one drink a day   Drug use: No   Sexual activity: Yes  Other Topics Concern   Not on file  Social History Narrative   SOME EXERCISE.   Social Determinants of Health   Financial Resource Strain: Not on file  Food Insecurity: Not on file  Transportation Needs: Not on file  Physical Activity: Not on file  Stress: Not on file  Social Connections: Not on file     Family History: The patient's family history includes Breast cancer in his paternal  grandmother; CAD in his mother; High blood pressure in his brother; Melanoma in his father; Stroke in his sister. There is no history of Colon cancer.  ROS:   Please see the history of present illness.    All other systems reviewed and are negative.  EKGs/Labs/Other Studies Reviewed:    The following studies were reviewed today: Vascular US 11/12/20: Summary:  Right Carotid: Mild: plaque visualized in the right internal carotid  artery. No evidence of hemodynamic significant stenosis.  Left Carotid: Mild: plaque visualized in the left internal carotid artery.  No evidence of hemodynamic significant stenosis.     Right ABI: The right ankle brachial index is  within normal limits,  suggesting the absence of significant arterial obstruction at rest.  Left ABI: The left ankle brachial index is within normal limits,  suggesting the absence of significant arterial obstruction at rest.     Aorta: There is no evidence of an abdominal aortic aneurysm. Mild  atherosclerosis noted throughout aorta.   CT Ca Score 12-23-19: IMPRESSION: 1. Coronary calcium score of 53. This was 36 percentile for age and sex matched control.   2.  Aortic atherosclerosis  EKG:  EKG is ordered today.  The ekg ordered today demonstrates NSR 69 bpm, within normal limits.   Recent Labs: No results found for requested labs within last 8760 hours.  Recent Lipid Panel    Component Value Date/Time   CHOL 148 06/26/2013 1018   TRIG 119 06/26/2013 1018   HDL 48 06/26/2013 1018   CHOLHDL 3.1 06/26/2013 1018   VLDL 24 06/26/2013 1018   LDLCALC 76 06/26/2013 1018     Risk Assessment/Calculations:           Physical Exam:    VS:  BP 128/84   Pulse 69   Ht 6\' 2"  (1.88 m)   Wt 186 lb 12.8 oz (84.7 kg)   SpO2 95%   BMI 23.98 kg/m     Wt Readings from Last 3 Encounters:  12/10/20 186 lb 12.8 oz (84.7 kg)  12/01/19 182 lb 12.8 oz (82.9 kg)  08/20/18 188 lb (85.3 kg)     GEN:  Well nourished, well developed in no acute distress HEENT: Normal NECK: No JVD; No carotid bruits LYMPHATICS: No lymphadenopathy CARDIAC: RRR, no murmurs, rubs, gallops RESPIRATORY:  Clear to auscultation without rales, wheezing or rhonchi  ABDOMEN: Soft, non-tender, non-distended MUSCULOSKELETAL:  No edema; No deformity  SKIN: Warm and dry NEUROLOGIC:  Alert and oriented x 3 PSYCHIATRIC:  Normal affect   ASSESSMENT:    1. Family history of premature CAD   2. Hyperlipidemia, unspecified hyperlipidemia type   3. Mixed hyperlipidemia    PLAN:    In order of problems listed above:  Continue risk reduction measures.  The patient is treated with aspirin 81 mg daily and  atorvastatin 20 mg daily. Lipids are followed by primary care who is in [place].  I cannot find results of his labs.  He will have them sent to me follows up.  He remains on atorvastatin. As above.  Plan to follow-up in 1 year.  Medication Adjustments/Labs and Tests Ordered: Current medicines are reviewed at length with the patient today.  Concerns regarding medicines are outlined above.  Orders Placed This Encounter  Procedures   EKG 12-Lead   No orders of the defined types were placed in this encounter.   Patient Instructions  Medication Instructions:  Your physician recommends that you continue on your current medications as directed. Please  refer to the Current Medication list given to you today.  *If you need a refill on your cardiac medications before your next appointment, please call your pharmacy*   Lab Work: None If you have labs (blood work) drawn today and your tests are completely normal, you will receive your results only by: [place] (if you have MyChart) OR A paper copy in the mail If you have any lab test that is abnormal or we need to change your treatment, we will call you to review the results.   Testing/Procedures: None   Follow-Up: At [facility], you and your health needs are our priority.  As part of our continuing mission to provide you with exceptional heart care, we have created designated Provider Care Teams.  These Care Teams include your primary Cardiologist (physician) and Advanced Practice Providers (APPs -  Physician Assistants and Nurse Practitioners) who all work together to provide you with the care you need, when you need it.  We recommend signing up for the patient portal called "MyChart".  Sign up information is provided on this After Visit Summary.  MyChart is used to connect with patients for Virtual Visits (Telemedicine).  Patients are able to view lab/test results, encounter notes, upcoming appointments, etc.  Non-urgent messages  can be sent to your provider as well.   To learn more about what you can do with MyChart, go to [email or web address].    Your next appointment:   1 year(s)  The format for your next appointment:   In Person  Provider:   You may see Sherren Mocha, MD or one of the following Advanced Practice Providers on your designated Care Team:   Richardson Dopp, PA-C Robbie Lis, [place]   Other Instructions     Signed, Sherren Mocha, MD  12/10/2020 4:59 PM    [facility]

## 2020-12-10 NOTE — Patient Instructions (Signed)
Medication Instructions:  Your physician recommends that you continue on your current medications as directed. Please refer to the Current Medication list given to you today.  *If you need a refill on your cardiac medications before your next appointment, please call your pharmacy*   Lab Work: None If you have labs (blood work) drawn today and your tests are completely normal, you will receive your results only by: [place] (if you have MyChart) OR A paper copy in the mail If you have any lab test that is abnormal or we need to change your treatment, we will call you to review the results.   Testing/Procedures: None   Follow-Up: At [facility], you and your health needs are our priority.  As part of our continuing mission to provide you with exceptional heart care, we have created designated Provider Care Teams.  These Care Teams include your primary Cardiologist (physician) and Advanced Practice Providers (APPs -  Physician Assistants and Nurse Practitioners) who all work together to provide you with the care you need, when you need it.  We recommend signing up for the patient portal called "MyChart".  Sign up information is provided on this After Visit Summary.  MyChart is used to connect with patients for Virtual Visits (Telemedicine).  Patients are able to view lab/test results, encounter notes, upcoming appointments, etc.  Non-urgent messages can be sent to your provider as well.   To learn more about what you can do with MyChart, go to [email or web address].    Your next appointment:   1 year(s)  The format for your next appointment:   In Person  Provider:   You may see Sherren Mocha, MD or one of the following Advanced Practice Providers on your designated Care Team:   Richardson Dopp, PA-C Robbie Lis, [place]   Other Instructions

## 2021-02-09 ENCOUNTER — Ambulatory Visit: Payer: Medicare Other | Admitting: Cardiovascular Disease

## 2021-06-02 ENCOUNTER — Ambulatory Visit: Payer: Medicare Other | Admitting: Cardiovascular Disease

## 2021-07-25 IMAGING — CT CT CARDIAC CORONARY ARTERY CALCIUM SCORE
3 series · 14 of 20 positions shown, 15 images · non-contrast
Comparison: CT 02/18/2014
COMPARISON: CT 02/18/2014

Addendum:
EXAM:
OVER-READ INTERPRETATION  CT CHEST

The following report is an over-read performed by radiologist Dr.
Harriet Og Michael Baysal [REDACTED] on 12/23/2019. This
over-read does not include interpretation of cardiac or coronary
anatomy or pathology. The coronary calcium score interpretation by
the cardiologist is attached.
CLINICAL DATA: Risk stratification in 74 year old male with strong
family history of CAD.
Coronary Calcium Score
TECHNIQUE: The patient was scanned on a Siemens Force scanner. Axial
non-contrast 3 mm slices were carried out through the heart. The
data set was analyzed on a dedicated work station and scored using
the Agatson method.

[Series 2: casc 3.0 bv41 2 bestdiast 71 % · axial · 0.28mm/px · z∈[+1210,+1300]mm · 4 of 51 slices shown, 5 images]
[im 11/51  vessel]
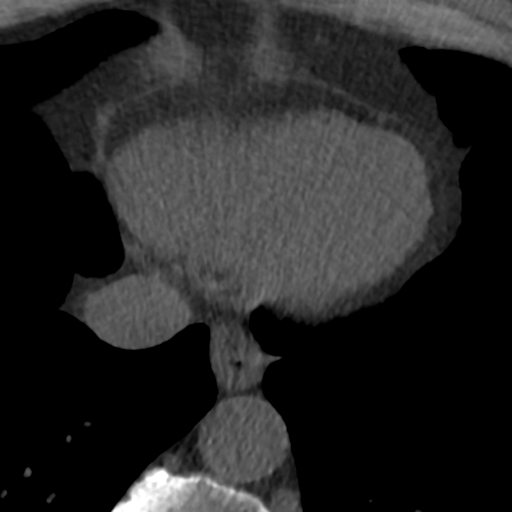
[im 11/51  lung]
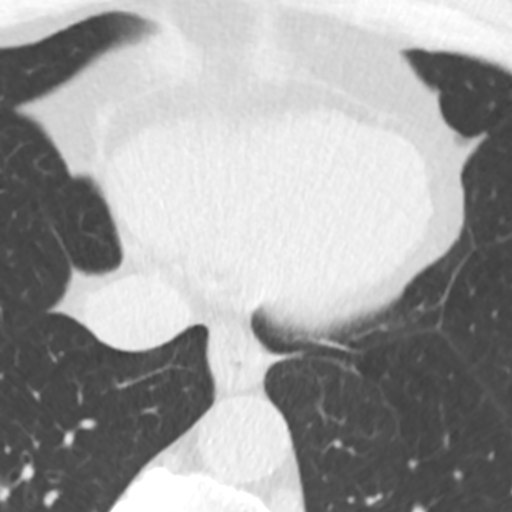
[im 21/51  vessel]
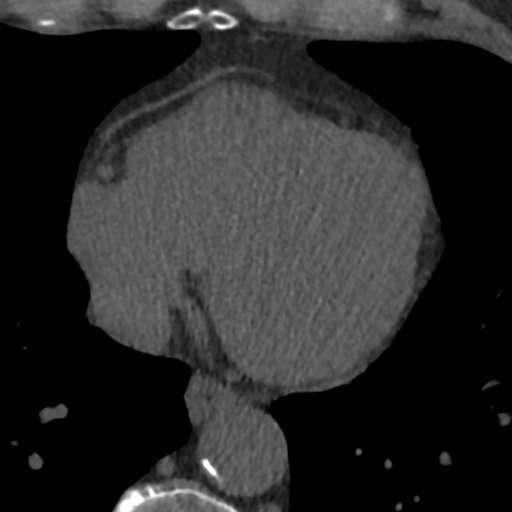
[im 31/51  vessel]
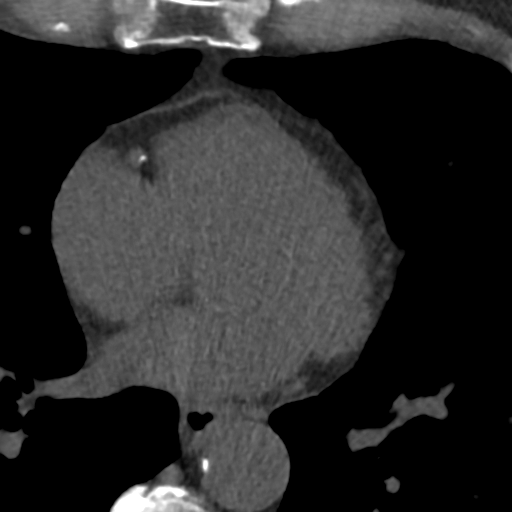
[im 41/51  vessel]
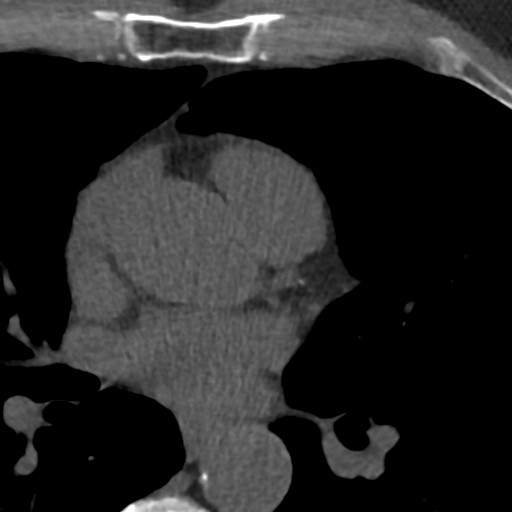

[Series 3: lung 71 % · axial · 0.64mm/px · z∈[+1202,+1304]mm · 5 of 52 slices shown]
[im 9/52  lung]
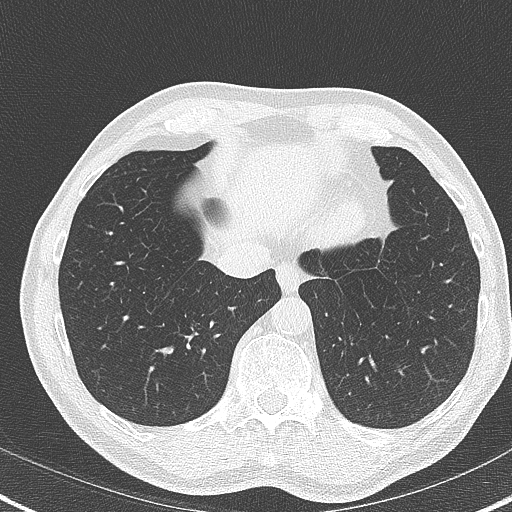
[im 18/52  lung]
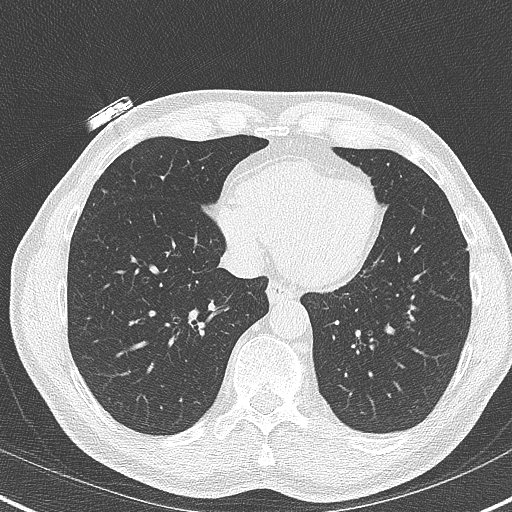
[im 26/52  lung]
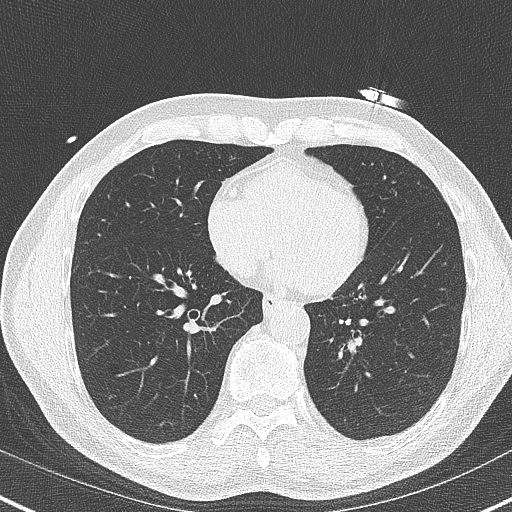
[im 35/52  lung]
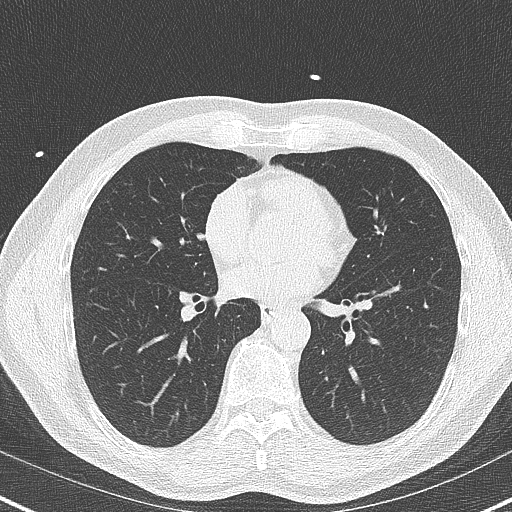
[im 43/52  lung]
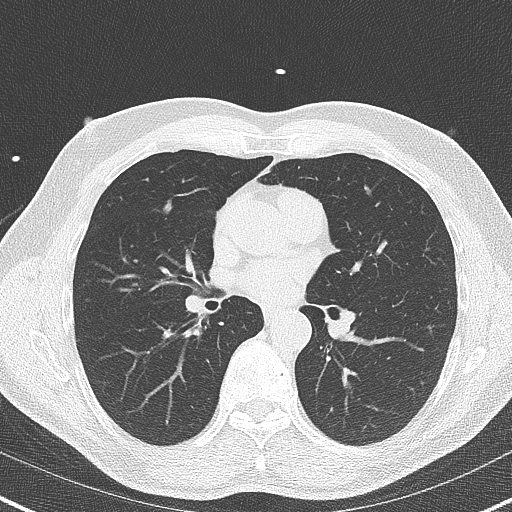

[Series 4: lung st 71 % · axial · 0.64mm/px · z∈[+1202,+1304]mm · 5 of 52 slices shown]
[im 9/52  lung]
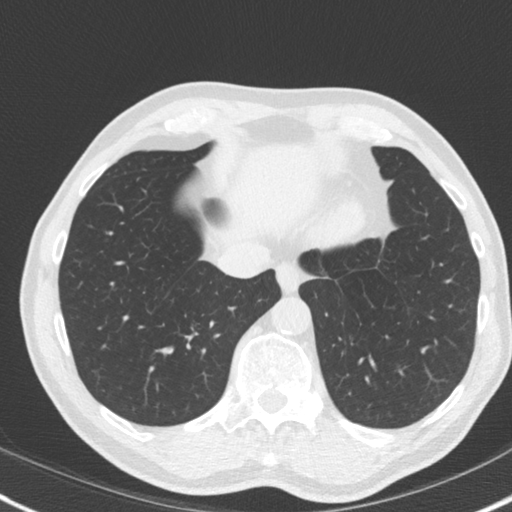
[im 18/52  lung]
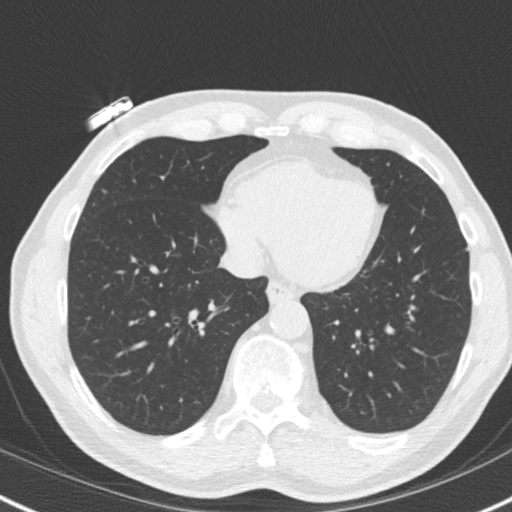
[im 26/52  lung]
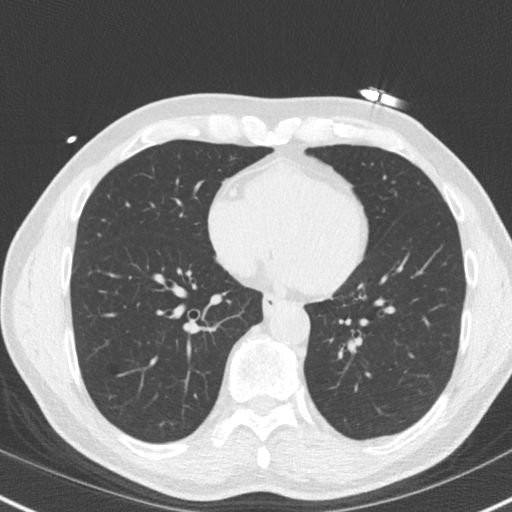
[im 35/52  lung]
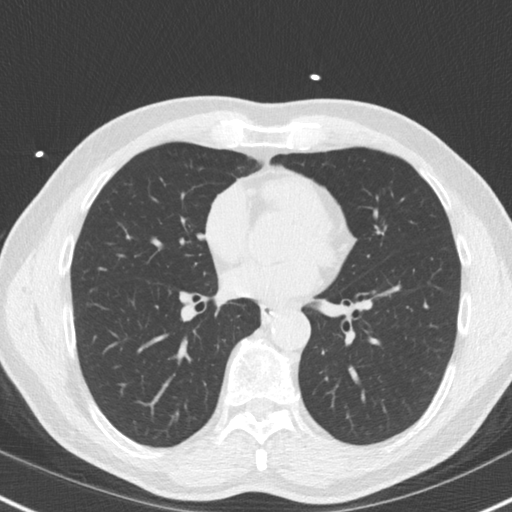
[im 43/52  lung]
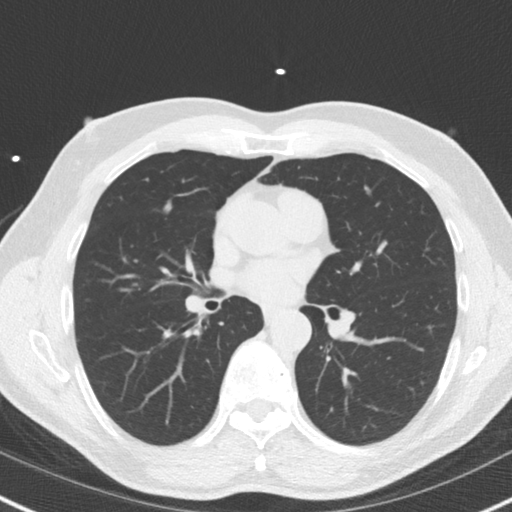

[14 of 20 positions shown; findings below may reference images not displayed]

FINDINGS: Limited view of the lung parenchyma demonstrates no suspicious
nodularity. Airways are normal.

Limited view of the mediastinum demonstrates no adenopathy.
Esophagus normal.

Limited view of the upper abdomen unremarkable.

Limited view of the skeleton and chest wall is unremarkable.
IMPRESSION: No significant extracardiac findings.
FINDINGS: Non-cardiac: See separate report from [REDACTED].

Aorta: Aortic atherosclerosis, moderate descending. Ascending root
36 mm, normal size.

Pericardium: Normal

Coronary arteries: RCA calcification.  Normal origins.
IMPRESSION: 1. Coronary calcium score of 53. This was 26 percentile for age and
sex matched control.

2.  Aortic atherosclerosis

*** End of Addendum ***
EXAM:
OVER-READ INTERPRETATION  CT CHEST

The following report is an over-read performed by radiologist Dr.
Harriet Og Michael Baysal [REDACTED] on 12/23/2019. This
over-read does not include interpretation of cardiac or coronary
anatomy or pathology. The coronary calcium score interpretation by
the cardiologist is attached.
FINDINGS: Limited view of the lung parenchyma demonstrates no suspicious
nodularity. Airways are normal.

Limited view of the mediastinum demonstrates no adenopathy.
Esophagus normal.

Limited view of the upper abdomen unremarkable.

Limited view of the skeleton and chest wall is unremarkable.
IMPRESSION: No significant extracardiac findings.

## 2021-12-12 ENCOUNTER — Encounter: Payer: Self-pay | Admitting: Cardiovascular Disease

## 2021-12-12 ENCOUNTER — Ambulatory Visit: Payer: Medicare Other | Attending: Cardiovascular Disease | Admitting: Cardiovascular Disease

## 2021-12-12 VITALS — BP 110/74 | HR 67 | Ht 74.0 in | Wt 180.2 lb

## 2021-12-12 DIAGNOSIS — E782 Mixed hyperlipidemia: Secondary | ICD-10-CM | POA: Diagnosis present

## 2021-12-12 DIAGNOSIS — Z8249 Family history of ischemic heart disease and other diseases of the circulatory system: Secondary | ICD-10-CM | POA: Insufficient documentation

## 2021-12-12 MED ORDER — EZETIMIBE 10 MG PO TABS: 10.0000 mg | ORAL_TABLET | Freq: Every day | ORAL | 3 refills | Status: DC

## 2021-12-12 NOTE — Progress Notes (Signed)
Cardiology Office Note:    Date:  12/12/2021   ID:  [name], DOB [birth year], MRN [id number]  PCP:  Kathyrn Lass   [place] Providers Cardiologist:  Sherren Mocha, MD     Referring MD: No ref. provider found   Chief Complaint  Patient presents with   Hyperlipidemia    History of Present Illness:    [name] is a 76 y.o. male with a hx of hyperlipidemia and strong family history of coronary artery disease, presenting for follow-up evaluation.  Previous testing includes a CT coronary calcium score in 2015 demonstrating a calcium score of 0.  And exercise treadmill study in September 2019 was negative for ischemia and demonstrated normal blood pressure response to exercise.  An echocardiogram in 2019 showed normal LV function with no significant valvular disease.  The patient is here alone today. We reviewed his family history again today. One of his siblings recently had a stroke and another required CABG and CEA.  The patient feels well.  He exercises with resistance training 3 days a week and also does some bike riding.  He has no exertional symptoms and specifically denies chest pain, chest pressure, or shortness of breath.  He has no heart palpitations, edema, orthopnea, or PND.  Past Medical History:  Diagnosis Date   Abnormal MRI    BRAIN   Acute renal insufficiency 02/09/2013   Anxiety    HSV (herpes simplex virus) infection    Hyperlipidemia    Kidney stones 02/09/2013   Nephrolithiasis    Weight loss     Past Surgical History:  Procedure Laterality Date   URETERAL STENT PLACEMENT      Current Medications: Current Meds  Medication Sig   aspirin 81 MG tablet Take 81 mg by mouth daily.   atorvastatin (LIPITOR) 20 MG tablet Take 10 mg by mouth daily.   econazole nitrate 1 % cream Apply topically 2 (two) times daily as needed.   ezetimibe (ZETIA) 10 MG tablet Take 1 tablet (10 mg total) by mouth daily.   sildenafil (REVATIO) 20 MG tablet  TAKE 3 TO 5 TABLETS AS NEEDED PRIOR TO SEXUAL ACTIVITY   tamsulosin (FLOMAX) 0.4 MG CAPS capsule Take 0.4 mg by mouth daily.    valACYclovir (VALTREX) 1000 MG tablet Take 1,000 mg by mouth as needed.   Current Facility-Administered Medications for the 12/12/21 encounter (Office Visit) with Sherren Mocha, MD  Medication   0.9 %  sodium chloride infusion     Allergies:   Patient has no known allergies.   Social History   Socioeconomic History   Marital status: Married    Spouse name: Not on file   Number of children: 2   Years of education: Not on file   Highest education level: Not on file  Occupational History   Occupation: RETIRED  Tobacco Use   Smoking status: Never   Smokeless tobacco: Never  Substance and Sexual Activity   Alcohol use: Yes    Alcohol/week: 14.0 standard drinks of alcohol    Types: 7 Standard drinks or equivalent, 7 Cans of beer per week    Comment: one drink a day   Drug use: No   Sexual activity: Yes  Other Topics Concern   Not on file  Social History Narrative   SOME EXERCISE.   Social Determinants of Health   Financial Resource Strain: Not on file  Food Insecurity: Not on file  Transportation Needs: Not on file  Physical Activity: Not  on file  Stress: Not on file  Social Connections: Not on file     Family History: The patient's family history includes Breast cancer in his paternal grandmother; CAD in his mother; High blood pressure in his brother; Melanoma in his father; Stroke in his sister. There is no history of Colon cancer.  ROS:   Please see the history of present illness.    All other systems reviewed and are negative.  EKGs/Labs/Other Studies Reviewed:    The following studies were reviewed today: Cardiac CT Ca Score: 12-23-2019 1. Coronary calcium score of 53. This was 17 percentile for age and sex matched control.   2.  Aortic atherosclerosis  Vascular Screen 11/12/2020: Summary:  Right Carotid: Mild: plaque visualized  in the right internal carotid  artery. No evidence of hemodynamic significant stenosis.  Left Carotid: Mild: plaque visualized in the left internal carotid artery.  No evidence of hemodynamic significant stenosis.     Right ABI: The right ankle brachial index is within normal limits,  suggesting the absence of significant arterial obstruction at rest.  Left ABI: The left ankle brachial index is within normal limits,  suggesting the absence of significant arterial obstruction at rest.     Aorta: There is no evidence of an abdominal aortic aneurysm. Mild  atherosclerosis noted throughout aorta.   EKG:  EKG is ordered today.  The ekg ordered today demonstrates NSR 67 bpm, within normal limits  Recent Labs: No results found for requested labs within last 365 days.  Recent Lipid Panel    Component Value Date/Time   CHOL 148 06/26/2013 1018   TRIG 119 06/26/2013 1018   HDL 48 06/26/2013 1018   CHOLHDL 3.1 06/26/2013 1018   VLDL 24 06/26/2013 1018   LDLCALC 76 06/26/2013 1018     Risk Assessment/Calculations:                Physical Exam:    VS:  BP 110/74   Pulse 67   Ht '6\' 2"'$  (1.88 m)   Wt 180 lb 3.2 oz (81.7 kg)   SpO2 98%   BMI 23.14 kg/m     Wt Readings from Last 3 Encounters:  12/12/21 180 lb 3.2 oz (81.7 kg)  12/10/20 186 lb 12.8 oz (84.7 kg)  12/01/19 182 lb 12.8 oz (82.9 kg)     GEN:  Well nourished, well developed in no acute distress HEENT: Normal NECK: No JVD; No carotid bruits LYMPHATICS: No lymphadenopathy CARDIAC: RRR, no murmurs, rubs, gallops RESPIRATORY:  Clear to auscultation without rales, wheezing or rhonchi  ABDOMEN: Soft, non-tender, non-distended MUSCULOSKELETAL:  No edema; No deformity  SKIN: Warm and dry NEUROLOGIC:  Alert and oriented x 3 PSYCHIATRIC:  Normal affect   ASSESSMENT:    1. Mixed hyperlipidemia   2. Family history of premature CAD    PLAN:    In order of problems listed above:  Last lipids available for my review  are from 2021 when he had a cholesterol 150, LDL 80, HDL 42, triglycerides 138.  We reviewed his very strong family history of atherosclerotic disease with multiple siblings experiencing strokes and another requiring carotid endarterectomy and CABG just recently.  We will try to be as aggressive as possible and reducing his risk and I have recommended that he add Zetia 10 mg daily to his medical regimen to push his LDL cholesterol below 70.  He has labs coming up in about 3 months with his primary care provider in [place].  We  will also check a vascular ultrasound screening exam per his request.  His last study is reviewed as above which showed patent carotid arteries with mild plaque bilaterally and no evidence of aneurysm.           Medication Adjustments/Labs and Tests Ordered: Current medicines are reviewed at length with the patient today.  Concerns regarding medicines are outlined above.  Orders Placed This Encounter  Procedures   EKG 12-Lead   VAS US VASCUSCREEN   Meds ordered this encounter  Medications   ezetimibe (ZETIA) 10 MG tablet    Sig: Take 1 tablet (10 mg total) by mouth daily.    Dispense:  90 tablet    Refill:  3    Patient Instructions  Medication Instructions:  Your physician recommends that you continue on your current medications as directed. Please refer to the Current Medication list given to you today.  *If you need a refill on your cardiac medications before your next appointment, please call your pharmacy*   Lab Work: NONE (please send copies from PCP office to [place] for review) If you have labs (blood work) drawn today and your tests are completely normal, you will receive your results only by: [place] (if you have MyChart) OR A paper copy in the mail If you have any lab test that is abnormal or we need to change your treatment, we will call you to review the results.   Testing/Procedures: Vascuscreen Imaging/Ultrasound   Follow-Up: At [facility], you and your health needs are our priority.  As part of our continuing mission to provide you with exceptional heart care, we have created designated Provider Care Teams.  These Care Teams include your primary Cardiologist (physician) and Advanced Practice Providers (APPs -  Physician Assistants and Nurse Practitioners) who all work together to provide you with the care you need, when you need it.  Your next appointment:   1 year(s)  The format for your next appointment:   In Person  Provider:   Sherren Mocha, MD       Important Information About Sugar         Signed, Sherren Mocha, MD  12/12/2021 4:27 PM    [place]

## 2021-12-12 NOTE — Patient Instructions (Signed)
Medication Instructions:  Your physician recommends that you continue on your current medications as directed. Please refer to the Current Medication list given to you today.  *If you need a refill on your cardiac medications before your next appointment, please call your pharmacy*   Lab Work: NONE (please send copies from PCP office to [place] for review) If you have labs (blood work) drawn today and your tests are completely normal, you will receive your results only by: [place] (if you have MyChart) OR A paper copy in the mail If you have any lab test that is abnormal or we need to change your treatment, we will call you to review the results.   Testing/Procedures: Vascuscreen Imaging/Ultrasound   Follow-Up: At [facility], you and your health needs are our priority.  As part of our continuing mission to provide you with exceptional heart care, we have created designated Provider Care Teams.  These Care Teams include your primary Cardiologist (physician) and Advanced Practice Providers (APPs -  Physician Assistants and Nurse Practitioners) who all work together to provide you with the care you need, when you need it.  Your next appointment:   1 year(s)  The format for your next appointment:   In Person  Provider:   Sherren Mocha, MD       Important Information About Sugar

## 2021-12-22 ENCOUNTER — Ambulatory Visit (HOSPITAL_COMMUNITY)
Admission: RE | Admit: 2021-12-22 | Discharge: 2021-12-22 | Disposition: A | Discharge status: Home / Self Care | Payer: Medicare Other | Source: Ambulatory Visit | Attending: Cardiovascular Disease | Admitting: Cardiovascular Disease

## 2021-12-22 DIAGNOSIS — Z8249 Family history of ischemic heart disease and other diseases of the circulatory system: Secondary | ICD-10-CM

## 2021-12-22 DIAGNOSIS — E782 Mixed hyperlipidemia: Secondary | ICD-10-CM

## 2022-01-03 ENCOUNTER — Encounter (HOSPITAL_COMMUNITY): Payer: Self-pay | Admitting: Cardiology

## 2022-08-22 ENCOUNTER — Telehealth: Payer: Self-pay | Admitting: *Deleted

## 2022-08-22 NOTE — Telephone Encounter (Signed)
S/w pt is ok for telephone clearance. Pt in car and would like to look at calendar.  Pt is thinking surgery in August. Pt will call back and ask for pre op team to schedule tele appt.

## 2022-08-22 NOTE — Telephone Encounter (Signed)
   Pre-operative Risk Assessment    Patient Name: [name]  DOB: 17-May-1945 MRN: [id number]      Request for Surgical Clearance    Procedure:   CERVICAL FUSION  Date of Surgery:  Clearance TBD                                Surgeon:  DR. Hoyt Koch Surgeon's Group or Practice Name:  [facility] Phone number:  [phone] [phone] Fax number:  [phone] ATTN: [id number]   Type of Clearance Requested:   - Medical ; ASA    Type of Anesthesia:  General    Additional requests/questions:    [name]   08/22/2022, 9:31 AM

## 2022-08-22 NOTE — Telephone Encounter (Signed)
   Name: [name]  DOB: October 15, 1945  MRN: [id number]  Primary Cardiologist: [name], MD   Preoperative team, please contact this patient and set up a phone call appointment for further preoperative risk assessment. Please obtain consent and complete medication review. Thank you for your help.  I confirm that guidance regarding antiplatelet and oral anticoagulation therapy has been completed and, if necessary, noted below.  Per office protocol, if patient is without any new symptoms or concerns at the time of their virtual visit, he may hold Aspirin for 5-7 days prior to procedure. Please resume Aspirin as soon as possible postprocedure, at the discretion of the surgeon.     Joylene Grapes, NP 08/22/2022, 11:29 AM [facility]

## 2022-08-23 ENCOUNTER — Telehealth: Payer: Self-pay | Admitting: Cardiovascular Disease

## 2022-08-23 ENCOUNTER — Telehealth: Payer: Self-pay | Admitting: *Deleted

## 2022-08-23 NOTE — Telephone Encounter (Signed)
Returned call to the pt.  He is aware that we have added it back to his medication list.

## 2022-08-23 NOTE — Telephone Encounter (Signed)
Pt scheduled for a tele visit 09/15/22.  Consent on file / medications reconciled.     Patient Consent for Virtual Visit        [name] has provided verbal consent on 08/23/2022 for a virtual visit (video or telephone).   CONSENT FOR VIRTUAL VISIT FOR:  [name]  By participating in this virtual visit I agree to the following:  I hereby voluntarily request, consent and authorize [facility] and its employed or contracted physicians, physician assistants, nurse practitioners or other licensed health care professionals (the Practitioner), to provide me with telemedicine health care services (the "Services") as deemed necessary by the treating Practitioner. I acknowledge and consent to receive the Services by the Practitioner via telemedicine. I understand that the telemedicine visit will involve communicating with the Practitioner through live audiovisual communication technology and the disclosure of certain medical information by electronic transmission. I acknowledge that I have been given the opportunity to request an in-person assessment or other available alternative prior to the telemedicine visit and am voluntarily participating in the telemedicine visit.  I understand that I have the right to withhold or withdraw my consent to the use of telemedicine in the course of my care at any time, without affecting my right to future care or treatment, and that the Practitioner or I may terminate the telemedicine visit at any time. I understand that I have the right to inspect all information obtained and/or recorded in the course of the telemedicine visit and may receive copies of available information for a reasonable fee.  I understand that some of the potential risks of receiving the Services via telemedicine include:  Delay or interruption in medical evaluation due to technological equipment failure or disruption; Information transmitted may not be sufficient (e.g.  poor resolution of images) to allow for appropriate medical decision making by the Practitioner; and/or  In rare instances, security protocols could fail, causing a breach of personal health information.  Furthermore, I acknowledge that it is my responsibility to provide information about my medical history, conditions and care that is complete and accurate to the best of my ability. I acknowledge that Practitioner's advice, recommendations, and/or decision may be based on factors not within their control, such as incomplete or inaccurate data provided by me or distortions of diagnostic images or specimens that may result from electronic transmissions. I understand that the practice of medicine is not an exact science and that Practitioner makes no warranties or guarantees regarding treatment outcomes. I acknowledge that a copy of this consent can be made available to me via my patient portal [facility] MyChart), or I can request a printed copy by calling the office of [facility].    I understand that my insurance will be billed for this visit.   I have read or had this consent read to me. I understand the contents of this consent, which adequately explains the benefits and risks of the Services being provided via telemedicine.  I have been provided ample opportunity to ask questions regarding this consent and the Services and have had my questions answered to my satisfaction. I give my informed consent for the services to be provided through the use of telemedicine in my medical care

## 2022-08-23 NOTE — Telephone Encounter (Signed)
Patient is returning call and is requesting return call.  

## 2022-08-23 NOTE — Telephone Encounter (Signed)
Pt scheduled for a tele visit 09/15/22.  Consent on file / medications reconciled.

## 2022-08-23 NOTE — Telephone Encounter (Signed)
Pt c/o medication issue:  1. Name of Medication: Tamsulosin .4 MG  2. How are you currently taking this medication (dosage and times per day)? Once a day   3. Are you having a reaction (difficulty breathing--STAT)? No   4. What is your medication issue? States this medication he is taking was not discussed in telehealth consent conversation with RMA.

## 2022-08-29 ENCOUNTER — Telehealth: Payer: Self-pay | Admitting: Cardiovascular Disease

## 2022-08-29 DIAGNOSIS — Z8249 Family history of ischemic heart disease and other diseases of the circulatory system: Secondary | ICD-10-CM

## 2022-08-29 DIAGNOSIS — Z789 Other specified health status: Secondary | ICD-10-CM

## 2022-08-29 DIAGNOSIS — E782 Mixed hyperlipidemia: Secondary | ICD-10-CM

## 2022-08-29 NOTE — Telephone Encounter (Signed)
Pt c/o medication issue:  1. Name of Medication: atorvastatin (LIPITOR) 20 MG tablet  2. How are you currently taking this medication (dosage and times per day)?   Take 10 mg by mouth daily.    3. Are you having a reaction (difficulty breathing--STAT)? No  4. What is your medication issue? Pt states he is having pain throughout his whole body and thinks it is being caused by the above medication. When pt stopped taking medication he stated the pain stopped and would like to know the next steps.   Pt would also like for Dr. Excell Seltzer to set up an appointment for the lifeline scan before his next appointment. Please advise.

## 2022-08-29 NOTE — Telephone Encounter (Signed)
Per OV note on 12/12/21: Last lipids available for my review are from 2021 when he had a cholesterol 150, LDL 80, HDL 42, triglycerides 138. We reviewed his very strong family history of atherosclerotic disease with multiple siblings experiencing strokes and another requiring carotid endarterectomy and CABG just recently. We will try to be as aggressive as possible and reducing his risk and I have recommended that he add Zetia 10 mg daily to his medical regimen to push his LDL cholesterol below 70. He has labs coming up in about 3 months with his primary care provider in [place]. We will also check a vascular ultrasound screening exam per his request. His last study is reviewed as above which showed patent carotid arteries with mild plaque bilaterally and no evidence of aneurysm.   Will route to MD to advise on possible trial of a different statin vs sending patient to pharmD clinic (should try and fail two statins to be eligible for PCSK9i). Last vascuscreen in 2023(but looks like patient does these annually):  Raymond Bollman, MD 12/26/2021  4:59 PM EDT     Mild carotid plaque with no blockage. Normal ABI's/blood flow. No aneurysm. Overall stable findings and recommend continue preventative measures.

## 2022-09-02 NOTE — Telephone Encounter (Signed)
Ok to do the vascular screening exam prior to his office visit. Would go ahead and refer to Pharm D clinic to see if he will qualify for PCSK9 or inclisiran. Thanks

## 2022-09-08 NOTE — Telephone Encounter (Signed)
Orders placed for vascuscreen and lipid clinic referral. Called and spoke to patient who is aware. States he has completely stopped Atorvastatin due to muscle pain. Removed from [facility] at this time.

## 2022-09-13 ENCOUNTER — Telehealth: Payer: Self-pay | Admitting: Cardiovascular Disease

## 2022-09-13 NOTE — Telephone Encounter (Signed)
I s/w pt and we have moved his tele pre op appt to 09/20/22 @ 10:20. Pt will be on a plane 09/15/22. Pt thanked me for my help.

## 2022-09-13 NOTE — Telephone Encounter (Signed)
See previous pre-op encounter.  Patient states he will be flying at the time of 7/12 telehealth appointment and he would like a call back to reschedule.

## 2022-09-15 ENCOUNTER — Ambulatory Visit: Payer: Medicare Other

## 2022-09-19 ENCOUNTER — Ambulatory Visit (HOSPITAL_COMMUNITY)
Admission: RE | Admit: 2022-09-19 | Discharge: 2022-09-19 | Disposition: A | Discharge status: Home / Self Care | Payer: Medicare Other | Source: Ambulatory Visit | Attending: Cardiovascular Disease | Admitting: Cardiovascular Disease

## 2022-09-19 DIAGNOSIS — Z8249 Family history of ischemic heart disease and other diseases of the circulatory system: Secondary | ICD-10-CM

## 2022-09-19 DIAGNOSIS — E782 Mixed hyperlipidemia: Secondary | ICD-10-CM

## 2022-09-20 ENCOUNTER — Ambulatory Visit: Payer: Medicare Other | Attending: Cardiology

## 2022-09-20 DIAGNOSIS — Z01810 Encounter for preprocedural cardiovascular examination: Secondary | ICD-10-CM | POA: Diagnosis not present

## 2022-09-20 NOTE — Progress Notes (Signed)
Virtual Visit via Telephone Note   Because of [name]'s co-morbid illnesses, he is at least at moderate risk for complications without adequate follow up.  This format is felt to be most appropriate for this patient at this time.  The patient did not have access to video technology/had technical difficulties with video requiring transitioning to audio format only (telephone).  All issues noted in this document were discussed and addressed.  No physical exam could be performed with this format.  Please refer to the patient's chart for his consent to telehealth for [facility].  Evaluation Performed:  Preoperative cardiovascular risk assessment _____________   Date:  09/20/2022   Patient ID:  [name], DOB September 05, 1945, MRN [id number] Patient Location:  Home Provider location:   Office  Primary Care Provider:  Pcp, No Primary Cardiologist:  [name], MD  Chief Complaint / Patient Profile   77 y.o. y/o male with a h/o hyperlipidemia, coronary artery disease who is pending cervical fusion and presents today for telephonic preoperative cardiovascular risk assessment.  History of Present Illness    [name] is a 77 y.o. male who presents via audio/video conferencing for a telehealth visit today.  Pt was last seen in cardiology clinic on 12/12/2021 by Dr. Excell Seltzer.  At that time [name] was doing well .  The patient is now pending procedure as outlined above. Since his last visit, he remains stable from a cardiac standpoint.  Today he  denies chest pain, shortness of breath, lower extremity edema, fatigue, palpitations, melena, hematuria, hemoptysis, diaphoresis, weakness, presyncope, syncope, orthopnea, and PND.   Past Medical History    Past Medical History:  Diagnosis Date   Abnormal MRI    BRAIN   Acute renal insufficiency 02/09/2013   Anxiety    HSV (herpes simplex virus) infection    Hyperlipidemia    Kidney stones 02/09/2013    Nephrolithiasis    Weight loss    Past Surgical History:  Procedure Laterality Date   URETERAL STENT PLACEMENT      Allergies  No Known Allergies  Home Medications    Prior to Admission medications   Medication Sig Start Date End Date Taking? Authorizing Provider  aspirin 81 MG tablet Take 81 mg by mouth daily.    [provider]  econazole nitrate 1 % cream Apply topically 2 (two) times daily as needed. 06/20/21   [provider]  ezetimibe (ZETIA) 10 MG tablet Take 1 tablet (10 mg total) by mouth daily. 12/12/21   [name], MD  sildenafil (REVATIO) 20 MG tablet TAKE 3 TO 5 TABLETS AS NEEDED PRIOR TO SEXUAL ACTIVITY 01/10/17   [name], MD  tamsulosin (FLOMAX) 0.4 MG CAPS capsule Take 0.4 mg by mouth daily.    [provider]  valACYclovir (VALTREX) 1000 MG tablet Take 1,000 mg by mouth as needed.    [provider]    Physical Exam    Vital Signs:  [name] does not have vital signs available for review today.  Given telephonic nature of communication, physical exam is limited. AAOx3. NAD. Normal affect.  Speech and respirations are unlabored.  Accessory Clinical Findings    None  Assessment & Plan    1.  Preoperative Cardiovascular Risk Assessment: Cervical fusion, Dr. Jonita Albee,  neurosurgery and spine, [phone]      Primary Cardiologist: [name], MD  Chart reviewed as part of pre-operative protocol coverage. Given past medical history and time  since last visit, based on ACC/AHA guidelines, [name] would be at acceptable risk for the planned procedure without further cardiovascular testing.   His RCRI is a class II risk, 0.9% risk of major cardiac event.  He is able to complete greater than 4 METS of physical activity.  He may hold Aspirin for 5-7 days prior to procedure. Please resume Aspirin as soon as possible postprocedure, at the discretion of the surgeon.   Patient was  advised that if he develops new symptoms prior to surgery to contact our office to arrange a follow-up appointment.  He verbalized understanding.       Time:   Today, I have spent 6 minutes with the patient with telehealth technology discussing medical history, symptoms, and management plan.  Prior to his phone evaluation I spent greater than 10 minutes reviewing his past medical history and cardiac medications.   Ronney Asters, NP  09/20/2022, 7:00 AM

## 2022-09-25 ENCOUNTER — Encounter (HOSPITAL_COMMUNITY): Payer: Self-pay | Admitting: Cardiology

## 2022-09-28 ENCOUNTER — Ambulatory Visit: Payer: Medicare Other | Attending: Cardiovascular Disease | Admitting: Pharmacist

## 2022-09-28 DIAGNOSIS — E785 Hyperlipidemia, unspecified: Secondary | ICD-10-CM | POA: Insufficient documentation

## 2022-09-28 MED ORDER — ROSUVASTATIN CALCIUM 5 MG PO TABS: 5.0000 mg | ORAL_TABLET | Freq: Every day | ORAL | 3 refills | Status: DC

## 2022-09-28 NOTE — Assessment & Plan Note (Signed)
Assessment: Patient with a strong family hx of CVA in his family His CAC was 84 in 2021 (26th percentile) Mild plaque in carotids, no PAD Had muscle pains to atorvastatin Only took ezetimibe on and off TG high on last labs in Jan Exercises 3 times a week Lives 1/2 in [place] 1/2 in [place] Discussed trying different statin vs PCSK9i Discussed diet, encourage decrease in pasta- white rice to brown rice. Advised alcohol can increase TG  Plan: Start rosuvastatin 5mg  daily Recheck labs in Oct before apt with Dr. Excell Seltzer

## 2022-09-28 NOTE — Progress Notes (Signed)
Patient ID: RICCI DIROCCO                 DOB: 1945-08-07                    MRN: [id number]      HPI: [name] is a 77 y.o. male patient referred to lipid clinic by Dr. Excell Seltzer. PMH is significant for hyperlipidemia, CAC 53 (26th percentile) in 2021 and strong family history of coronary artery disease.  Was started on ezetimibe in Oct 2023. Patient called office in June stating he stopped his atorvastatin due to muscle pain. Felt much better off of it.  Patient presents today to lipid clinic. Repots he took atorvastatin for 5 years before he had any issues. He only took ezetimibe on and off. Said he felt much better the day after he stopped atorvastatin. He exercises both cardio and weighs 3 times a week.  TG were elevated in Jan. Patient unsure if he fasted. We did discuss foods that can increase TG. He reports he eats a lot of pasta.  Reviewed options for lowering LDL cholesterol, including trying another statin or PCSK-9 inhibitors. Discussed mechanisms of action, dosing, side effects and potential decreases in LDL cholesterol.  Also reviewed cost information.   Current Medications: none Intolerances: atorvastatin (muscle pain) Risk Factors: strong family history LDL-C goal: <70  Diet: lots of pasta, fish, vegetables, beans, fruit  Exercise: stationary bike, treadmill, lifts weights 3 x week  Family History: One of his siblings recently had a stroke and another required CABG and CEA.   Social History:  Social History   Socioeconomic History   Marital status: Widowed    Spouse name: Not on file   Number of children: 2   Years of education: Not on file   Highest education level: Not on file  Occupational History   Occupation: RETIRED  Tobacco Use   Smoking status: Never   Smokeless tobacco: Never  Substance and Sexual Activity   Alcohol use: Yes    Alcohol/week: 14.0 standard drinks of alcohol    Types: 7 Standard drinks or equivalent, 7 Cans of beer per week     Comment: one drink a day   Drug use: No   Sexual activity: Yes  Other Topics Concern   Not on file  Social History Narrative   SOME EXERCISE.   Social Determinants of Health   Financial Resource Strain: Not on file  Food Insecurity: Not on file  Transportation Needs: Not on file  Physical Activity: Not on file  Stress: Not on file  Social Connections: Not on file  Intimate Partner Violence: Not on file     Labs: Lipid Panel  03/17/22 TC 143, TG 312, HDL 35, LDL-C 46 (atrovastatin and zetia?)     Component Value Date/Time   CHOL 148 06/26/2013 1018   TRIG 119 06/26/2013 1018   HDL 48 06/26/2013 1018   CHOLHDL 3.1 06/26/2013 1018   VLDL 24 06/26/2013 1018   LDLCALC 76 06/26/2013 1018    Past Medical History:  Diagnosis Date   Abnormal MRI    BRAIN   Acute renal insufficiency 02/09/2013   Anxiety    HSV (herpes simplex virus) infection    Hyperlipidemia    Kidney stones 02/09/2013   Nephrolithiasis    Weight loss     Current Outpatient Medications on File Prior to Visit  Medication Sig Dispense Refill   aspirin 81 MG tablet Take 81 mg by  mouth daily.     econazole nitrate 1 % cream Apply topically 2 (two) times daily as needed.     sildenafil (REVATIO) 20 MG tablet TAKE 3 TO 5 TABLETS AS NEEDED PRIOR TO SEXUAL ACTIVITY 50 tablet 0   tamsulosin (FLOMAX) 0.4 MG CAPS capsule Take 0.4 mg by mouth daily.     valACYclovir (VALTREX) 1000 MG tablet Take 1,000 mg by mouth as needed.     Current Facility-Administered Medications on File Prior to Visit  Medication Dose Route Frequency Provider Last Rate Last Admin   0.9 %  sodium chloride infusion  500 mL Intravenous Continuous Meryl Dare, MD        No Known Allergies  Assessment/Plan:  1. Hyperlipidemia -  Hyperlipidemia Assessment: Patient with a strong family hx of CVA in his family His CAC was 53 in 2021 (26th percentile) Mild plaque in carotids, no PAD Had muscle pains to atorvastatin Only took  ezetimibe on and off TG high on last labs in Jan Exercises 3 times a week Lives 1/2 in [place] 1/2 in [place] Discussed trying different statin vs PCSK9i Discussed diet, encourage decrease in pasta- white rice to brown rice. Advised alcohol can increase TG  Plan: Start rosuvastatin 5mg  daily Recheck labs in Oct before apt with Dr. Excell Seltzer   Thank you,  Olene Floss, Pharm.D, BCACP, BCPS, CPP [facility] [address] [place], [place] [address]  Phone: [phone]; Fax: [phone]

## 2022-09-28 NOTE — Patient Instructions (Addendum)
Start taking rosuvastatin 5mg  daily Please call me at [phone] if you need to change the date of labs or you do not tolerate medication.

## 2022-11-30 ENCOUNTER — Other Ambulatory Visit: Payer: Self-pay | Admitting: Neurosurgery

## 2022-12-14 ENCOUNTER — Ambulatory Visit: Payer: Medicare Other | Attending: Cardiovascular Disease

## 2022-12-14 DIAGNOSIS — E785 Hyperlipidemia, unspecified: Secondary | ICD-10-CM

## 2022-12-14 LAB — LIPID PANEL
Chol/HDL Ratio: 3.8 {ratio} (ref 0.0–5.0)
Cholesterol, Total: 181 mg/dL (ref 100–199)
HDL: 48 mg/dL (ref >39)
LDL Chol Calc (NIH): 108 mg/dL — ABNORMAL HIGH (ref 0–99)
Triglycerides: 142 mg/dL (ref 0–149)
VLDL Cholesterol Cal: 25 mg/dL (ref 5–40)

## 2022-12-14 LAB — HEPATIC FUNCTION PANEL
ALT: 23 [IU]/L (ref 0–44)
AST: 21 [IU]/L (ref 0–40)
Albumin: 4.2 g/dL (ref 3.8–4.8)
Alkaline Phosphatase: 127 [IU]/L — ABNORMAL HIGH (ref 44–121)
Bilirubin Total: 0.6 mg/dL (ref 0.0–1.2)
Bilirubin, Direct: 0.21 mg/dL (ref 0.00–0.40)
Total Protein: 6.2 g/dL (ref 6.0–8.5)

## 2022-12-15 ENCOUNTER — Encounter: Payer: Self-pay | Admitting: Pharmacist

## 2022-12-21 ENCOUNTER — Encounter: Payer: Self-pay | Admitting: Cardiovascular Disease

## 2022-12-21 ENCOUNTER — Other Ambulatory Visit: Payer: Medicare Other

## 2022-12-21 ENCOUNTER — Ambulatory Visit: Payer: Medicare Other | Attending: Cardiovascular Disease | Admitting: Cardiovascular Disease

## 2022-12-21 VITALS — BP 110/70 | HR 76 | Ht 74.0 in | Wt 170.2 lb

## 2022-12-21 DIAGNOSIS — Z8249 Family history of ischemic heart disease and other diseases of the circulatory system: Secondary | ICD-10-CM

## 2022-12-21 DIAGNOSIS — E782 Mixed hyperlipidemia: Secondary | ICD-10-CM | POA: Diagnosis present

## 2022-12-21 MED ORDER — ATORVASTATIN CALCIUM 20 MG PO TABS: 20.0000 mg | ORAL_TABLET | Freq: Every day | ORAL | 3 refills | Status: AC

## 2022-12-21 NOTE — Patient Instructions (Signed)
Medication Instructions:  START Atorvastatin 20mg  daily *If you need a refill on your cardiac medications before your next appointment, please call your pharmacy*  Follow-Up: At [facility] you and your health needs are our priority.  As part of our continuing mission to provide you with exceptional heart care, we have created designated Provider Care Teams.  These Care Teams include your primary Cardiologist (physician) and Advanced Practice Providers (APPs -  Physician Assistants and Nurse Practitioners) who all work together to provide you with the care you need, when you need it.  Your next appointment:   1 year(s)  Provider:   [name], MD

## 2022-12-21 NOTE — Assessment & Plan Note (Signed)
Patient with aortic atherosclerosis, strong family history of cardiovascular disease, and mild plaquing without obstruction in the carotid arteries based on my personal review of his vascular ultrasound screening test done in July of this year.  Aggressive lipid-lowering is indicated.  We discussed potential options today.  He has seen Melissa in the [facility] lipid clinic.  He has failed trials of low-dose rosuvastatin.  He is willing to try atorvastatin 20 mg daily which she tolerated a little better than rosuvastatin.  I advised him that if he has recurrent joint aches or myalgias, he needs to reach out to [place] and we can get him set up for a follow-up visit in the lipid clinic to initiate a PCSK9 inhibitor.  He understands.  He is already scheduled for lipids and LFTs to be drawn in [place] in January.  Otherwise the patient is clinically stable and no other changes are made in his medical regimen today.

## 2022-12-21 NOTE — Progress Notes (Signed)
Cardiology Office Note:    Date:  12/21/2022   ID:  [name], DOB 1945/12/31, MRN [id number]  PCP:  [name]   [facility] Providers Cardiologist:  Raymond Bollman, MD     Referring MD: No ref. provider found   Chief Complaint  Patient presents with   Follow-up    Hyperlipidemia    History of Present Illness:    [name] is a 77 y.o. male presenting for follow-up of hyperlipidemia.  The patient has a strong family history of coronary artery disease.  He had a CT coronary calcium score in 2015 demonstrating a calcium score of 0.  An echocardiogram in 2019 showed normal LV function with no valvular disease.  A repeat CT calcium score in 2021 showed a coronary calcium score of 53 which placed him in the 26 percentile.  He was also noted to have aortic atherosclerosis at that time.  The patient is here alone today.  He lost his wife earlier this year after a long battle with breast cancer.  He continues to spend time between [place] and [place], approximately 6 months each.  He remains physically active with exercise and has no exertional symptoms.  He denies chest pain, chest pressure, shortness of breath, or heart palpitations.  He had problems with joint aches on statin drugs.  He has tried atorvastatin and rosuvastatin.  Seems like he is have more problems on rosuvastatin within a week of taking this medication.  He recently had lipids drawn with an LDL cholesterol of 108.  His LDL in the past has been in the 50s.   Current Medications: Current Meds  Medication Sig   aspirin 81 MG tablet Take 81 mg by mouth daily.   atorvastatin (LIPITOR) 20 MG tablet Take 1 tablet (20 mg total) by mouth daily.   econazole nitrate 1 % cream Apply topically 2 (two) times daily as needed.   sildenafil (REVATIO) 20 MG tablet TAKE 3 TO 5 TABLETS AS NEEDED PRIOR TO SEXUAL ACTIVITY   tamsulosin (FLOMAX) 0.4 MG CAPS capsule Take 0.4 mg by mouth daily.   valACYclovir (VALTREX)  1000 MG tablet Take 1,000 mg by mouth as needed.   [DISCONTINUED] rosuvastatin (CRESTOR) 5 MG tablet Take 1 tablet (5 mg total) by mouth daily.   Current Facility-Administered Medications for the 12/21/22 encounter (Office Visit) with Raymond Bollman, MD  Medication   0.9 %  sodium chloride infusion     Allergies:   Patient has no known allergies.   ROS:   Please see the history of present illness.    All other systems reviewed and are negative.  EKGs/Labs/Other Studies Reviewed:    The following studies were reviewed today: Cardiac Studies & Procedures     STRESS TESTS  EXERCISE TOLERANCE TEST (ETT) 11/27/2017  Narrative  Blood pressure demonstrated a normal response to exercise.  There was no ST segment deviation noted during stress.  No T wave inversion was noted during stress.  Excellent exercise capacity.  Negative, adequate stress test.   ECHOCARDIOGRAM  ECHOCARDIOGRAM COMPLETE 11/27/2017  Narrative *[facility]* [address] [place], [place] [address] [phone]  ------------------------------------------------------------------- Transthoracic Echocardiography  Patient:    [name] MR #:       [id number] Study Date: 11/27/2017 Gender:     M Age:        72 Height:     188 cm Weight:     86.2 kg BSA:  2.12 m^2 Pt. Status: Room:  Dora Sims, MD REFERRING    Raymond Bollman, MD SONOGRAPHER  Clearence Ped, RCS PERFORMING   [facility], Outpatient ATTENDING    Hel[facility]  cc:  ------------------------------------------------------------------- LV EF: 60% -   65%  ------------------------------------------------------------------- Indications:      Dyspnea (R06.09).  ------------------------------------------------------------------- History:   PMH:   Stroke.  Risk factors:  Hypertension.  ------------------------------------------------------------------- Study Conclusions  - Left ventricle: The  cavity size was normal. Systolic function was normal. The estimated ejection fraction was in the range of 60% to 65%. Wall motion was normal; there were no regional wall motion abnormalities. Left ventricular diastolic function parameters were normal. - Aortic valve: There was no regurgitation. - Mitral valve: There was trivial regurgitation. - Right ventricle: Systolic function was normal. - Tricuspid valve: There was mild regurgitation. - Pulmonary arteries: Systolic pressure was within the normal range. - Inferior vena cava: The vessel was normal in size. The respirophasic diameter changes were in the normal range (= 50%), consistent with normal central venous pressure.  ------------------------------------------------------------------- Labs, prior tests, procedures, and surgery: Coronary artery bypass grafting.  ------------------------------------------------------------------- Study data:  No prior study was available for comparison.  Study status:  Routine.  Procedure:  Transthoracic echocardiography. Image quality was adequate.          Transthoracic echocardiography.  M-mode, complete 2D, spectral Doppler, and color Doppler.  Birthdate:  Patient birthdate: 1945/04/11.  Age:  Patient is 77 yr old.  Sex:  Gender: male.    BMI: 24.4 kg/m^2.  Blood pressure:     111/89  Patient status:  Outpatient.  Study date: Study date: 11/27/2017. Study time: 01:15 PM.  Location:  [facility]  -------------------------------------------------------------------  ------------------------------------------------------------------- Left ventricle:  The cavity size was normal. Systolic function was normal. The estimated ejection fraction was in the range of 60% to 65%. Wall motion was normal; there were no regional wall motion abnormalities. The transmitral flow pattern was normal. The deceleration time of the early transmitral flow velocity was normal. The pulmonary vein flow pattern  was normal. The tissue Doppler parameters were normal. Left ventricular diastolic function parameters were normal.  ------------------------------------------------------------------- Aortic valve:   Trileaflet; normal thickness leaflets. Mobility was not restricted.  Doppler:  Transvalvular velocity was within the normal range. There was no stenosis. There was no regurgitation.  ------------------------------------------------------------------- Aorta:  Aortic root: The aortic root was normal in size. Ascending aorta: The ascending aorta was normal in size.  ------------------------------------------------------------------- Mitral valve:   Structurally normal valve.   Mobility was not restricted.  Doppler:  Transvalvular velocity was within the normal range. There was no evidence for stenosis. There was trivial regurgitation.    Peak gradient (D): 2 mm Hg.  ------------------------------------------------------------------- Left atrium:  The atrium was normal in size.  ------------------------------------------------------------------- Right ventricle:  The cavity size was normal. Wall thickness was normal. Systolic function was normal.  ------------------------------------------------------------------- Pulmonic valve:    Structurally normal valve.   Cusp separation was normal.  Doppler:  Transvalvular velocity was within the normal range. There was no evidence for stenosis. There was no regurgitation.  ------------------------------------------------------------------- Tricuspid valve:   Structurally normal valve.    Doppler: Transvalvular velocity was within the normal range. There was mild regurgitation.  ------------------------------------------------------------------- Pulmonary artery:   The main pulmonary artery was normal-sized. Systolic pressure was within the normal range.  ------------------------------------------------------------------- Right atrium:  The  atrium was normal in size.  ------------------------------------------------------------------- Pericardium:  There was no  pericardial effusion.  ------------------------------------------------------------------- Systemic veins: Inferior vena cava: The vessel was normal in size. The respirophasic diameter changes were in the normal range (= 50%), consistent with normal central venous pressure. Diameter: 18 mm.  ------------------------------------------------------------------- Measurements  IVC                                        Value        Reference ID                                         18    mm     ----------  Left ventricle                             Value        Reference LV ID, ED, PLAX chordal            (L)     41.4  mm     43 - 52 LV ID, ES, PLAX chordal                    29    mm     23 - 38 LV fx shortening, PLAX chordal             30    %      >=29 LV PW thickness, ED                        12.4  mm     ---------- IVS/LV PW ratio, ED                        0.8          <=1.3 Stroke volume, 2D                          66    ml     ---------- Stroke volume/bsa, 2D                      31    ml/m^2 ---------- LV e&', lateral                             10.3  cm/s   ---------- LV E/e&', lateral                           7.63         ---------- LV e&', medial                              8.49  cm/s   ---------- LV E/e&', medial                            9.26         ---------- LV e&', average  9.4   cm/s   ---------- LV E/e&', average                           8.37         ----------  Ventricular septum                         Value        Reference IVS thickness, ED                          9.98  mm     ----------  LVOT                                       Value        Reference LVOT ID, S                                 20    mm     ---------- LVOT area                                  3.14  cm^2   ---------- LVOT peak  velocity, S                      100   cm/s   ---------- LVOT mean velocity, S                      68.7  cm/s   ---------- LVOT VTI, S                                21    cm     ----------  Aorta                                      Value        Reference Aortic root ID, ED                         33    mm     ----------  Left atrium                                Value        Reference LA ID, A-P, ES                             31    mm     ---------- LA ID/bsa, A-P                             1.46  cm/m^2 <=2.2 LA volume, S  33.4  ml     ---------- LA volume/bsa, S                           15.7  ml/m^2 ---------- LA volume, ES, 1-p A4C                     36.6  ml     ---------- LA volume/bsa, ES, 1-p A4C                 17.2  ml/m^2 ---------- LA volume, ES, 1-p A2C                     29.1  ml     ---------- LA volume/bsa, ES, 1-p A2C                 13.7  ml/m^2 ----------  Mitral valve                               Value        Reference Mitral E-wave peak velocity                78.6  cm/s   ---------- Mitral A-wave peak velocity                60.6  cm/s   ---------- Mitral deceleration time           (H)     292   ms     150 - 230 Mitral peak gradient, D                    2     mm Hg  ---------- Mitral E/A ratio, peak                     1.3          ----------  Pulmonary arteries                         Value        Reference PA pressure, S, DP                         18    mm Hg  <=30  Tricuspid valve                            Value        Reference Tricuspid regurg peak velocity             193   cm/s   ---------- Tricuspid peak RV-RA gradient              15    mm Hg  ---------- Tricuspid maximal regurg velocity,         193   cm/s   ---------- PISA  Right atrium                               Value        Reference RA ID, S-I, ES, A4C                        48.3  mm  34 - 49 RA area, ES, A4C                           18.8  cm^2    8.3 - 19.5 RA volume, ES, A/L                         61.9  ml     ---------- RA volume/bsa, ES, A/L                     29.1  ml/m^2 ----------  Systemic veins                             Value        Reference Estimated CVP                              3     mm Hg  ----------  Right ventricle                            Value        Reference TAPSE                                      19.8  mm     ---------- RV pressure, S, DP                         18    mm Hg  <=30 RV s&', lateral, S                          11.2  cm/s   ----------  Legend: (L)  and  (H)  mark values outside specified reference range.  ------------------------------------------------------------------- Prepared and Electronically Authenticated by  Tobias Alexander, M.D. 2019-09-24T20:26:44     CT SCANS  CT CARDIAC SCORING (SELF PAY ONLY) 12/23/2019  Addendum 12/24/2019  8:36 AM ADDENDUM REPORT: 12/24/2019 08:33  CLINICAL DATA:  Risk stratification in 77 year old male with strong family history of CAD.  EXAM: Coronary Calcium Score  TECHNIQUE: The patient was scanned on a [employer]. Axial non-contrast 3 mm slices were carried out through the heart. The data set was analyzed on a dedicated work station and scored using the Agatson method.  FINDINGS: Non-cardiac: See separate report from [facility].  Aorta: Aortic atherosclerosis, moderate descending. Ascending root 36 mm, normal size.  Pericardium: Normal  Coronary arteries: RCA calcification.  Normal origins.  IMPRESSION: 1. Coronary calcium score of 53. This was 26 percentile for age and sex matched control.  2.  Aortic atherosclerosis  Donato Schultz, MD [facility]   Electronically Signed By: Donato Schultz MD On: 12/24/2019 08:33  Narrative EXAM: OVER-READ INTERPRETATION  CT CHEST  The following report is an over-read performed by radiologist Dr. Genevive Bi of [facility] on 12/23/2019.  This over-read does not include interpretation of cardiac or coronary anatomy or pathology. The coronary calcium score interpretation by the cardiologist is attached.  COMPARISON:  CT 02/18/2014  FINDINGS: Limited view of the lung parenchyma demonstrates no suspicious nodularity. Airways are normal.  Limited view of the  mediastinum demonstrates no adenopathy. Esophagus normal.  Limited view of the upper abdomen unremarkable.  Limited view of the skeleton and chest wall is unremarkable.  IMPRESSION: No significant extracardiac findings.  Electronically Signed: By: Genevive Bi M.D. On: 12/23/2019 14:24   CT SCANS  CT CARDIAC SCORING (SELF PAY ONLY) 02/18/2014  Addendum 02/18/2014  5:17 PM ADDENDUM REPORT: 02/18/2014 17:14  CLINICAL DATA:  Risk stratification  EXAM: Coronary Calcium Score  TECHNIQUE: The patient was scanned on a Siemens Sensation 16 slice scanner. Axial non-contrast 3 mm slices were carried out through the heart. The data set was analyzed on a dedicated work station and scored using the Agatson method.  FINDINGS: Non-cardiac: No significant non cardiac findings on limited lung and soft tissue windows. See separate report from [facility].  Ascending Aorta:  Normal caliber.  Pericardium: Normal  Coronary arteries:  Normal origin.  IMPRESSION: Coronary calcium score of 0. This was 0 percentile for age and sex matched control.  Tobias Alexander   Electronically Signed By: Tobias Alexander On: 02/18/2014 17:14  Narrative EXAM: OVER-READ INTERPRETATION  CT CHEST  The following report is an over-read performed by radiologist Dr. Schuyler Amor [facility] on 02/18/2014. This over-read does not include interpretation of cardiac or coronary anatomy or pathology. The coronary calcium score/coronary CTA interpretation by the cardiologist is attached.  COMPARISON:  CT abdomen and pelvis from 01/30/2013 no  additional nodules identified.  FINDINGS: Mediastinum: No mediastinal adenopathy.  Lungs/Pleura: No pleural effusion identified. There is no airspace consolidation or atelectasis. Stable tiny nodule in the left lower lobe measuring 2 mm, image 49/series 4.  Upper Abdomen: The visualized portions of the liver in spleen are unremarkable.  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  IMPRESSION: 1. No acute findings. 2. Pulmonary nodule within the left lower lobe has been stable for over 12 months and is compatible with a benign abnormality. This recommendation follows the consensus statement: Guidelines for Management of Small Pulmonary Nodules Detected on CT Scans: A Statement from the Fleischner Society as published in Radiology 2005; 237:395-400.  Electronically Signed: By: Signa Kell M.D. On: 02/18/2014 11:44          EKG:   EKG Interpretation Date/Time:  Thursday December 21 2022 09:15:50 EDT Ventricular Rate:  76 PR Interval:  182 QRS Duration:  90 QT Interval:  374 QTC Calculation: 420 R Axis:   73  Text Interpretation: Normal sinus rhythm Normal ECG No previous ECGs available Confirmed by [name] [id number]) on 12/21/2022 9:25:28 AM    Recent Labs: 12/14/2022: ALT 23  Recent Lipid Panel    Component Value Date/Time   CHOL 181 12/14/2022 0728   TRIG 142 12/14/2022 0728   HDL 48 12/14/2022 0728   CHOLHDL 3.8 12/14/2022 0728   CHOLHDL 3.1 06/26/2013 1018   VLDL 24 06/26/2013 1018   LDLCALC 108 (H) 12/14/2022 0728     Risk Assessment/Calculations:                Physical Exam:    VS:  BP 110/70   Pulse 76   Ht 6\' 2"  (1.88 m)   Wt 170 lb 3.2 oz (77.2 kg)   SpO2 97%   BMI 21.85 kg/m     Wt Readings from Last 3 Encounters:  12/21/22 170 lb 3.2 oz (77.2 kg)  12/12/21 180 lb 3.2 oz (81.7 kg)  12/10/20 186 lb 12.8 oz (84.7 kg)     GEN:  Well nourished,  well developed in no acute distress HEENT:  Normal NECK: No JVD; No carotid bruits LYMPHATICS: No lymphadenopathy CARDIAC: RRR, no murmurs, rubs, gallops RESPIRATORY:  Clear to auscultation without rales, wheezing or rhonchi  ABDOMEN: Soft, non-tender, non-distended MUSCULOSKELETAL:  No edema; No deformity  SKIN: Warm and dry NEUROLOGIC:  Alert and oriented x 3 PSYCHIATRIC:  Normal affect   Assessment & Plan Mixed hyperlipidemia Patient with aortic atherosclerosis, strong family history of cardiovascular disease, and mild plaquing without obstruction in the carotid arteries based on my personal review of his vascular ultrasound screening test done in July of this year.  Aggressive lipid-lowering is indicated.  We discussed potential options today.  He has seen Melissa in the [place] lipid clinic.  He has failed trials of low-dose rosuvastatin.  He is willing to try atorvastatin 20 mg daily which she tolerated a little better than rosuvastatin.  I advised him that if he has recurrent joint aches or myalgias, he needs to reach out to [place] and we can get him set up for a follow-up visit in the lipid clinic to initiate a PCSK9 inhibitor.  He understands.  He is already scheduled for lipids and LFTs to be drawn in [place] in January.  Otherwise the patient is clinically stable and no other changes are made in his medical regimen today.            Medication Adjustments/Labs and Tests Ordered: Current medicines are reviewed at length with the patient today.  Concerns regarding medicines are outlined above.  Orders Placed This Encounter  Procedures   EKG 12-Lead   Meds ordered this encounter  Medications   atorvastatin (LIPITOR) 20 MG tablet    Sig: Take 1 tablet (20 mg total) by mouth daily.    Dispense:  90 tablet    Refill:  3    Patient Instructions  Medication Instructions:  START Atorvastatin 20mg  daily *If you need a refill on your cardiac medications before your next appointment, please call your  pharmacy*  Follow-Up: At [facility], you and your health needs are our priority.  As part of our continuing mission to provide you with exceptional heart care, we have created designated Provider Care Teams.  These Care Teams include your primary Cardiologist (physician) and Advanced Practice Providers (APPs -  Physician Assistants and Nurse Practitioners) who all work together to provide you with the care you need, when you need it.  Your next appointment:   1 year(s)  Provider:   [name], MD        Signed, Raymond Bollman, MD  12/21/2022 12:45 PM    [facility]

## 2023-01-10 NOTE — Pre-Procedure Instructions (Signed)
Surgical Instructions   Your procedure is scheduled on Thursday, November 14th. Report to [facility] Main Entrance "A" at 05:30 A.M., then check in with the Admitting office. Any questions or running late day of surgery: call [phone]  Questions prior to your surgery date: call [phone], Monday-Friday, 8am-4pm. If you experience any cold or flu symptoms such as cough, fever, chills, shortness of breath, etc. between now and your scheduled surgery, please notify us at the above number.     Remember:  Do not eat or drink after midnight the night before your surgery    Take these medicines the morning of surgery with A SIP OF WATER  atorvastatin (LIPITOR)  tamsulosin [facility])   May take these medicines IF NEEDED: hydroxypropyl methylcellulose / hypromellose (ISOPTO TEARS / GONIOVISC)  valACYclovir (VALTREX)    Follow your surgeon's instructions on when to stop Aspirin.  If no instructions were given by your surgeon then you will need to call the office to get those instructions.    One week prior to surgery, STOP taking any Aleve, Naproxen, Ibuprofen, Motrin, Advil, Goody's, BC's, all herbal medications, fish oil, and non-prescription vitamins.                     Do NOT Smoke (Tobacco/Vaping) for 24 hours prior to your procedure.  If you use a CPAP at night, you may bring your mask/headgear for your overnight stay.   You will be asked to remove any contacts, glasses, piercing's, hearing aid's, dentures/partials prior to surgery. Please bring cases for these items if needed.    Patients discharged the day of surgery will not be allowed to drive home, and someone needs to stay with them for 24 hours.  SURGICAL WAITING ROOM VISITATION Patients may have no more than 2 support people in the waiting area - these visitors may rotate.   Pre-op nurse will coordinate an appropriate time for 1 ADULT support person, who may not rotate, to accompany patient in pre-op.  Children under  the age of 72 must have an adult with them who is not the patient and must remain in the main waiting area with an adult.  If the patient needs to stay at the hospital during part of their recovery, the visitor guidelines for inpatient rooms apply.  Please refer to the [facility] website for the visitor guidelines for any additional information.   If you received a COVID test during your pre-op visit  it is requested that you wear a mask when out in public, stay away from anyone that may not be feeling well and notify your surgeon if you develop symptoms. If you have been in contact with anyone that has tested positive in the last 10 days please notify you surgeon.      Pre-operative 5 CHG Bathing Instructions   You can play a key role in reducing the risk of infection after surgery. Your skin needs to be as free of germs as possible. You can reduce the number of germs on your skin by washing with CHG (chlorhexidine gluconate) soap before surgery. CHG is an antiseptic soap that kills germs and continues to kill germs even after washing.   DO NOT use if you have an allergy to chlorhexidine/CHG or antibacterial soaps. If your skin becomes reddened or irritated, stop using the CHG and notify one of our RNs at [phone].   Please shower with the CHG soap starting 4 days before surgery using the following schedule:  Please keep in mind the following:  DO NOT shave, including legs and underarms, starting the day of your first shower.   You may shave your face at any point before/day of surgery.  Place clean sheets on your bed the day you start using CHG soap. Use a clean washcloth (not used since being washed) for each shower. DO NOT sleep with pets once you start using the CHG.   CHG Shower Instructions:  Wash your face and private area with normal soap. If you choose to wash your hair, wash first with your normal shampoo.  After you use shampoo/soap, rinse your hair and body  thoroughly to remove shampoo/soap residue.  Turn the water OFF and apply about 3 tablespoons (45 ml) of CHG soap to a CLEAN washcloth.  Apply CHG soap ONLY FROM YOUR NECK DOWN TO YOUR TOES (washing for 3-5 minutes)  DO NOT use CHG soap on face, private areas, open wounds, or sores.  Pay special attention to the area where your surgery is being performed.  If you are having back surgery, having someone wash your back for you may be helpful. Wait 2 minutes after CHG soap is applied, then you may rinse off the CHG soap.  Pat dry with a clean towel  Put on clean clothes/pajamas   If you choose to wear lotion, please use ONLY the CHG-compatible lotions on the back of this paper.   Additional instructions for the day of surgery: DO NOT APPLY any lotions, deodorants, cologne, or perfumes.   Do not bring valuables to the hospital. [facility] is not responsible for any belongings/valuables. Do not wear nail polish, gel polish, artificial nails, or any other type of covering on natural nails (fingers and toes) Do not wear jewelry or makeup Put on clean/comfortable clothes.  Please brush your teeth.  Ask your nurse before applying any prescription medications to the skin.     CHG Compatible Lotions   Aveeno Moisturizing lotion  Cetaphil Moisturizing Cream  Cetaphil Moisturizing Lotion  Clairol Herbal Essence Moisturizing Lotion, Dry Skin  Clairol Herbal Essence Moisturizing Lotion, Extra Dry Skin  Clairol Herbal Essence Moisturizing Lotion, Normal Skin  Curel Age Defying Therapeutic Moisturizing Lotion with Alpha Hydroxy  Curel Extreme Care Body Lotion  Curel Soothing Hands Moisturizing Hand Lotion  Curel Therapeutic Moisturizing Cream, Fragrance-Free  Curel Therapeutic Moisturizing Lotion, Fragrance-Free  Curel Therapeutic Moisturizing Lotion, Original Formula  Eucerin Daily Replenishing Lotion  Eucerin Dry Skin Therapy Plus Alpha Hydroxy Crme  Eucerin Dry Skin Therapy Plus Alpha  Hydroxy Lotion  Eucerin Original Crme  Eucerin Original Lotion  Eucerin Plus Crme Eucerin Plus Lotion  Eucerin TriLipid Replenishing Lotion  Keri Anti-Bacterial Hand Lotion  Keri Deep Conditioning Original Lotion Dry Skin Formula Softly Scented  Keri Deep Conditioning Original Lotion, Fragrance Free Sensitive Skin Formula  Keri Lotion Fast Absorbing Fragrance Free Sensitive Skin Formula  Keri Lotion Fast Absorbing Softly Scented Dry Skin Formula  Keri Original Lotion  Keri Skin Renewal Lotion Keri Silky Smooth Lotion  Keri Silky Smooth Sensitive Skin Lotion  Nivea Body Creamy Conditioning Oil  Nivea Body Extra Enriched Lotion  Nivea Body Original Lotion  Nivea Body Sheer Moisturizing Lotion Nivea Crme  Nivea Skin Firming Lotion  NutraDerm 30 Skin Lotion  NutraDerm Skin Lotion  NutraDerm Therapeutic Skin Cream  NutraDerm Therapeutic Skin Lotion  ProShield Protective Hand Cream  Provon moisturizing lotion  Please read over the following fact sheets that you were given.

## 2023-01-11 ENCOUNTER — Encounter (HOSPITAL_COMMUNITY): Payer: Self-pay

## 2023-01-11 ENCOUNTER — Other Ambulatory Visit: Payer: Self-pay

## 2023-01-11 ENCOUNTER — Encounter (HOSPITAL_COMMUNITY)
Admission: RE | Admit: 2023-01-11 | Discharge: 2023-01-11 | Disposition: A | Discharge status: Home / Self Care | Payer: Medicare Other | Source: Ambulatory Visit | Attending: Neurosurgery | Admitting: Neurosurgery

## 2023-01-11 VITALS — BP 122/73 | HR 73 | Temp 97.7°F | Resp 17 | Ht 74.0 in | Wt 173.0 lb

## 2023-01-11 DIAGNOSIS — Z8249 Family history of ischemic heart disease and other diseases of the circulatory system: Secondary | ICD-10-CM | POA: Diagnosis not present

## 2023-01-11 DIAGNOSIS — Z960 Presence of urogenital implants: Secondary | ICD-10-CM | POA: Diagnosis not present

## 2023-01-11 DIAGNOSIS — E785 Hyperlipidemia, unspecified: Secondary | ICD-10-CM | POA: Diagnosis not present

## 2023-01-11 DIAGNOSIS — I6782 Cerebral ischemia: Secondary | ICD-10-CM | POA: Insufficient documentation

## 2023-01-11 DIAGNOSIS — Z01812 Encounter for preprocedural laboratory examination: Secondary | ICD-10-CM | POA: Insufficient documentation

## 2023-01-11 DIAGNOSIS — Z87442 Personal history of urinary calculi: Secondary | ICD-10-CM | POA: Diagnosis not present

## 2023-01-11 DIAGNOSIS — F1090 Alcohol use, unspecified, uncomplicated: Secondary | ICD-10-CM

## 2023-01-11 DIAGNOSIS — Z7982 Long term (current) use of aspirin: Secondary | ICD-10-CM | POA: Insufficient documentation

## 2023-01-11 DIAGNOSIS — Z01818 Encounter for other preprocedural examination: Secondary | ICD-10-CM

## 2023-01-11 DIAGNOSIS — M4712 Other spondylosis with myelopathy, cervical region: Secondary | ICD-10-CM | POA: Diagnosis not present

## 2023-01-11 DIAGNOSIS — Z789 Other specified health status: Secondary | ICD-10-CM | POA: Diagnosis not present

## 2023-01-11 HISTORY — DX: Unspecified osteoarthritis, unspecified site: M19.90

## 2023-01-11 LAB — CBC
HCT: 43.4 % (ref 39.0–52.0)
Hemoglobin: 14.8 g/dL (ref 13.0–17.0)
MCH: 32.2 pg (ref 26.0–34.0)
MCHC: 34.1 g/dL (ref 30.0–36.0)
MCV: 94.6 fL (ref 80.0–100.0)
Platelets: 199 10*3/uL (ref 150–400)
RBC: 4.59 MIL/uL (ref 4.22–5.81)
RDW: 13.2 % (ref 11.5–15.5)
WBC: 5 10*3/uL (ref 4.0–10.5)
nRBC: 0 % (ref 0.0–0.2)

## 2023-01-11 LAB — COMPREHENSIVE METABOLIC PANEL
ALT: 24 U/L (ref 0–44)
AST: 25 U/L (ref 15–41)
Albumin: 3.9 g/dL (ref 3.5–5.0)
Alkaline Phosphatase: 74 U/L (ref 38–126)
Anion gap: 5 (ref 5–15)
BUN: 26 mg/dL — ABNORMAL HIGH (ref 8–23)
CO2: 27 mmol/L (ref 22–32)
Calcium: 9.9 mg/dL (ref 8.9–10.3)
Chloride: 107 mmol/L (ref 98–111)
Creatinine, Ser: 1.06 mg/dL (ref 0.61–1.24)
GFR, Estimated: >60 mL/min (ref >60)
Glucose, Bld: 123 mg/dL — ABNORMAL HIGH (ref 70–99)
Potassium: 4.5 mmol/L (ref 3.5–5.1)
Sodium: 139 mmol/L (ref 135–145)
Total Bilirubin: 0.8 mg/dL (ref <1.2)
Total Protein: 6.8 g/dL (ref 6.5–8.1)

## 2023-01-11 LAB — TYPE AND SCREEN
ABO/RH(D): O POS
Antibody Screen: NEGATIVE

## 2023-01-11 LAB — SURGICAL PCR SCREEN
MRSA, PCR: NEGATIVE
Staphylococcus aureus: NEGATIVE

## 2023-01-11 NOTE — Progress Notes (Signed)
   01/11/23 1017  OBSTRUCTIVE SLEEP APNEA  Have you ever been diagnosed with sleep apnea through a sleep study? No  Do you snore loudly (loud enough to be heard through closed doors)?  1  Do you often feel tired, fatigued, or sleepy during the daytime (such as falling asleep during driving or talking to someone)? 1  Has anyone observed you stop breathing during your sleep? 1  Do you have, or are you being treated for high blood pressure? 0  BMI more than 35 kg/m2? 0  Age > 50 (1-yes) 1  Neck circumference greater than:Male 16 inches or larger, Male 17inches or larger? 0  Male Gender (Yes=1) 1  Obstructive Sleep Apnea Score 5

## 2023-01-11 NOTE — Progress Notes (Signed)
PCP - Dr. Cruz Condon- here in [facility] Dr. Louanne Skye- in [place]- pt does also have a house in [place]  Cardiologist - Dr. [name]  PPM/ICD - denies   Chest x-ray - 04/17/11 EKG - 12/21/22 Stress Test - 11/27/17 ECHO - 11/27/17 Cardiac Cath - denies  Sleep Study - OSA+ CPAP - denies, unable to tolerate  DM- denies  Blood Thinner Instructions: n/a Aspirin Instructions: ASA last dose 10/31 (hold 1 week)  ERAS Protcol - no, NPO   COVID TEST- n/a   Anesthesia review: yes, cardiac hx  Patient denies shortness of breath, fever, cough and chest pain at PAT appointment   All instructions explained to the patient, with a verbal understanding of the material. Patient agrees to go over the instructions while at home for a better understanding.  The opportunity to ask questions was provided.

## 2023-01-12 NOTE — Anesthesia Preprocedure Evaluation (Signed)
Anesthesia Evaluation  Patient identified by MRN, date of birth, ID band Patient awake    Reviewed: Allergy & Precautions, H&P , NPO status , Patient's Chart, lab work & pertinent test results  Airway Mallampati: II   Neck ROM: full    Dental   Pulmonary neg pulmonary ROS   breath sounds clear to auscultation       Cardiovascular negative cardio ROS  Rhythm:regular Rate:Normal     Neuro/Psych Myasthenia gravis  Neuromuscular disease    GI/Hepatic   Endo/Other    Renal/GU      Musculoskeletal   Abdominal   Peds  Hematology   Anesthesia Other Findings   Reproductive/Obstetrics                             Anesthesia Physical Anesthesia Plan  ASA: 2  Anesthesia Plan: General   Post-op Pain Management:    Induction: Intravenous  PONV Risk Score and Plan: 2 and Ondansetron, Dexamethasone, Midazolam and Treatment may vary due to age or medical condition  Airway Management Planned: Double Lumen EBT  Additional Equipment: Arterial line  Intra-op Plan:   Post-operative Plan: Extubation in OR  Informed Consent: I have reviewed the patients History and Physical, chart, labs and discussed the procedure including the risks, benefits and alternatives for the proposed anesthesia with the patient or authorized representative who has indicated his/her understanding and acceptance.     Dental advisory given  Plan Discussed with: CRNA, Anesthesiologist and Surgeon  Anesthesia Plan Comments: (PAT note written 01/12/2023 by Shonna Chock, PA-C.  )       Anesthesia Quick Evaluation  01/11/2023                CO2                      27                  01/11/2023                GLUCOSE                  123 (H)             01/11/2023                BUN                      26 (H)              01/11/2023                CREATININE               1.06                01/11/2023                CALCIUM                  9.9                 01/11/2023                GFRNONAA                 >60                 01/11/2023              Abdominal Normal abdominal exam  (+)   Peds  Hematology   Anesthesia Other Findings    Reproductive/Obstetrics                             Anesthesia Physical Anesthesia Plan  ASA: 2  Anesthesia Plan: General   Post-op Pain Management: Toradol IV (intra-op)*, Ofirmev IV (intra-op)* and Ketamine IV*   Induction: Intravenous  PONV Risk Score and Plan: 2 and Ondansetron, Dexamethasone and Treatment may vary due to age or medical condition  Airway Management Planned: Mask, Oral ETT and Video Laryngoscope Planned  Additional Equipment: None  Intra-op Plan:   Post-operative Plan: Extubation in OR  Informed Consent: I have reviewed the patients History and Physical, chart, labs and discussed the procedure including the risks, benefits and alternatives for the proposed anesthesia with the patient or authorized representative who has indicated his/her understanding and acceptance.     Dental advisory given  Plan Discussed with: CRNA  Anesthesia Plan Comments: (PAT note written 01/12/2023 by Shonna Chock, PA-C.  )       Anesthesia Quick Evaluation

## 2023-01-12 NOTE — Progress Notes (Signed)
Anesthesia Chart Review:  Case: [id number] Date/Time: 01/18/23 0715   Procedure: ACDF C34, C45 - 3C   Anesthesia type: General   Pre-op diagnosis: CERVICAL SPONDYLOSIS WITH MYELOPATHY   Location: [facility] OR ROOM 20 / [facility] OR   Surgeons: Raymond Person, MD       DISCUSSION: Patient is a 77 year old male scheduled for the above procedure.  History includes never smoker, HLD, nephrolithiasis with AKI (2014, s/p ureteral stent), abnormal brain MRI (atrophy, small vessel ischemic changes 11/25/07). Alcohol intake is documented as 7 standard drinks/week. OSA screening score of 5.   Last office visit with cardiologist Dr. Excell Jenkins was on 12/21/22 for HLD follow-up with strong family history of CAD. He had a CT coronary calcium score in 2015 demonstrating a calcium score of 0. An echocardiogram in 2019 showed normal LV function with no valvular disease. A repeat CT calcium score in 2021 showed a coronary calcium score of 53 which placed him in the 26 percentile. He was also noted to have aortic atherosclerosis at that time. Mild carotid plaque on July 2024 [place]. He has been seen by pharmacist in their Lipid clinic. He did not tolerate atorvastatin or rosuvastatin due to muscle pain, but was open to trial of atorvastatin at lower dose 20 mg daily. He may be considered for PCSK9 in the future. Next lipid panel and LFTs are scheduled to be drawn in [place]. No new changes.   Previous preoperative risk assessment on 09/20/22 by Raymond Fabian, NP: "Given past medical history and time since last visit, based on ACC/AHA guidelines, [name] would be at acceptable risk for the planned procedure without further cardiovascular testing.    His RCRI is a class II risk, 0.9% risk of major cardiac event.  He is able to complete greater than 4 METS of physical activity.   He may hold Aspirin for 5-7 days prior to procedure. Please resume Aspirin as soon as possible postprocedure, at the discretion of the  surgeon."  Anesthesia team to evaluate on the day of surgery.  VS: BP 122/73   Pulse 73   Temp 36.5 C   Resp 17   Ht 6\' 2"  (1.88 m)   Wt 78.5 kg   SpO2 100%   BMI 22.21 kg/m   PROVIDERS: PCP is Dr. Cruz Jenkins in [place]. He also has a house in [place] and sees Dr. Louanne Jenkins when living in [place]. Raymond Bollman, MD is cardiologist   LABS: Labs reviewed: Acceptable for surgery. (all labs ordered are listed, but only abnormal results are displayed)  Labs Reviewed  COMPREHENSIVE METABOLIC PANEL - Abnormal; Notable for the following components:      Result Value   Glucose, Bld 123 (*)    BUN 26 (*)    All other components within normal limits  SURGICAL PCR SCREEN  CBC  TYPE AND SCREEN     IMAGES: MRI L-spine 12/27/22 (Canopy/PACS): IMPRESSION: 1. Acute to subacute L5 inferior compression fracture affecting the inferior endplate with 15-20% loss of height. No retropulsion or complicating features. 2. Underlying moderate levoconvex lumbar scoliosis. Widespread advanced lumbar disc and endplate degeneration. Additional degenerative endplate marrow edema including at L1-L2, L3-L4. 3. Up to moderate multifactorial spinal stenosis at L1-L2 (just below the conus), mild to moderate at T12-L1 (conus level), and mild at L3-L4. Moderate to severe lateral recess and/or neural foraminal stenosis at the right L2, bilateral L3, left L4 nerve levels.  MRI Brain 11/25/07 (Canopy/PACS): IMPRESSION:  Atrophy. Small vessel  ischemic changes. No evidence of tumor, hemorrhage, hydrocephalus, herniation, or infarction.    EKG: 12/21/22: NSR   CV: US Carotid 09/19/22: Summary:  Right Carotid: Mild: plaque visualized in the right internal carotid  artery.  Left Carotid: Mild: plaque visualized in the left internal carotid artery.   ABIs 09/19/22:  Right ABI: The right ankle brachial index is within normal limits,  suggesting the absence of significant arterial  obstruction at rest.  Left ABI: The left ankle brachial index is within normal limits,  suggesting the absence of significant arterial obstruction at rest.    [place] Abd Aorta 09/19/22 Aorta: There is no evidence of an abdominal aortic aneurysm.    CT Cardiac Calcium Score 12/23/19: IMPRESSION: 1. Coronary calcium score of 53. This was 26 percentile for age and sex matched control. 2.  Aortic atherosclerosis   Echo 11/27/17: Study Conclusions  - Left ventricle: The cavity size was normal. Systolic function was    normal. The estimated ejection fraction was in the range of 60%    to 65%. Wall motion was normal; there were no regional wall    motion abnormalities. Left ventricular diastolic function    parameters were normal.  - Aortic valve: There was no regurgitation.  - Mitral valve: There was trivial regurgitation.  - Right ventricle: Systolic function was normal.  - Tricuspid valve: There was mild regurgitation.  - Pulmonary arteries: Systolic pressure was within the normal    range.  - Inferior vena cava: The vessel was normal in size. The    respirophasic diameter changes were in the normal range (= 50%),    consistent with normal central venous pressure.    ETT 11/27/17: Blood pressure demonstrated a normal response to exercise. There was no ST segment deviation noted during stress. No T wave inversion was noted during stress. Excellent exercise capacity. Negative, adequate stress test.   Past Medical History:  Diagnosis Date   Abnormal MRI    BRAIN   Acute renal insufficiency 02/09/2013   Anxiety    Arthritis    HSV (herpes simplex virus) infection    Hyperlipidemia    Kidney stones 02/09/2013   Nephrolithiasis    Weight loss     Past Surgical History:  Procedure Laterality Date   REFRACTIVE SURGERY  1998   URETERAL STENT PLACEMENT  2014    MEDICATIONS:  aspirin 81 MG tablet   atorvastatin (LIPITOR) 20 MG tablet   hydroxypropyl methylcellulose /  hypromellose (ISOPTO TEARS / GONIOVISC) 2.5 % ophthalmic solution   sildenafil (REVATIO) 20 MG tablet   tamsulosin (FLOMAX) 0.4 MG CAPS capsule   trolamine salicylate (ASPERCREME) 10 % cream   valACYclovir (VALTREX) 1000 MG tablet    0.9 %  sodium chloride infusion    Raymond Chock, PA-C Surgical Short Stay/Anesthesiology [facility] Phone [phone] [facility] Phone [phone] 01/12/2023 6:35 PM

## 2023-01-17 ENCOUNTER — Other Ambulatory Visit: Payer: Self-pay | Admitting: Neurosurgery

## 2023-01-18 ENCOUNTER — Encounter (HOSPITAL_COMMUNITY): Payer: Self-pay | Admitting: *Deleted

## 2023-01-18 ENCOUNTER — Encounter (HOSPITAL_COMMUNITY)
Admission: RE | Disposition: A | Discharge status: Home / Self Care | Payer: Self-pay | Source: Home / Self Care | Attending: Neurosurgery

## 2023-01-18 ENCOUNTER — Other Ambulatory Visit: Payer: Self-pay

## 2023-01-18 ENCOUNTER — Observation Stay (HOSPITAL_COMMUNITY)
Admission: RE | Admit: 2023-01-18 | Discharge: 2023-01-19 | Disposition: A | Discharge status: Home / Self Care | Payer: Medicare Other | Attending: Neurosurgery | Admitting: Neurosurgery

## 2023-01-18 ENCOUNTER — Ambulatory Visit (HOSPITAL_COMMUNITY): Payer: Medicare Other

## 2023-01-18 ENCOUNTER — Ambulatory Visit (HOSPITAL_COMMUNITY): Payer: Medicare Other | Admitting: Vascular Surgery

## 2023-01-18 ENCOUNTER — Ambulatory Visit (HOSPITAL_COMMUNITY): Payer: Medicare Other | Admitting: Registered Nurse

## 2023-01-18 DIAGNOSIS — M4712 Other spondylosis with myelopathy, cervical region: Secondary | ICD-10-CM

## 2023-01-18 DIAGNOSIS — Z960 Presence of urogenital implants: Secondary | ICD-10-CM | POA: Diagnosis not present

## 2023-01-18 DIAGNOSIS — M4802 Spinal stenosis, cervical region: Principal | ICD-10-CM | POA: Diagnosis present

## 2023-01-18 HISTORY — PX: ANTERIOR CERVICAL DECOMP/DISCECTOMY FUSION: SHX1161

## 2023-01-18 LAB — ABO/RH: ABO/RH(D): O POS

## 2023-01-18 SURGERY — ANTERIOR CERVICAL DECOMPRESSION/DISCECTOMY FUSION 2 LEVELS
Anesthesia: General

## 2023-01-18 MED ORDER — LACTATED RINGERS IV SOLN
INTRAVENOUS | Status: DC
Start: 2023-01-18 — End: 2023-01-18

## 2023-01-18 MED ORDER — FENTANYL CITRATE (PF) 250 MCG/5ML IJ SOLN
INTRAMUSCULAR | Status: AC
  Filled 2023-01-18: qty 5

## 2023-01-18 MED ORDER — KETAMINE HCL 50 MG/5ML IJ SOSY
PREFILLED_SYRINGE | INTRAMUSCULAR | Status: AC
  Filled 2023-01-18: qty 5

## 2023-01-18 MED ORDER — CHLORHEXIDINE GLUCONATE CLOTH 2 % EX PADS: 6.0000 | MEDICATED_PAD | Freq: Once | CUTANEOUS | Status: DC

## 2023-01-18 MED ORDER — FENTANYL CITRATE (PF) 100 MCG/2ML IJ SOLN
INTRAMUSCULAR | Status: AC
  Filled 2023-01-18: qty 2

## 2023-01-18 MED ORDER — ACETAMINOPHEN 10 MG/ML IV SOLN
INTRAVENOUS | Status: DC | PRN
  Administered 2023-01-18: 1000 mg via INTRAVENOUS

## 2023-01-18 MED ORDER — LIDOCAINE 2% (20 MG/ML) 5 ML SYRINGE
INTRAMUSCULAR | Status: DC | PRN
  Administered 2023-01-18: 60 mg via INTRAVENOUS

## 2023-01-18 MED ORDER — DOCUSATE SODIUM 100 MG PO CAPS
100.0000 mg | ORAL_CAPSULE | Freq: Two times a day (BID) | ORAL | Status: DC
  Administered 2023-01-18 – 2023-01-19 (×3): 100 mg via ORAL
  Filled 2023-01-18 (×3): qty 1

## 2023-01-18 MED ORDER — PHENOL 1.4 % MT LIQD: 1.0000 | OROMUCOSAL | Status: DC | PRN

## 2023-01-18 MED ORDER — PHENYLEPHRINE HCL-NACL 20-0.9 MG/250ML-% IV SOLN
INTRAVENOUS | Status: DC | PRN
  Administered 2023-01-18: 10 ug/min via INTRAVENOUS

## 2023-01-18 MED ORDER — BUPIVACAINE HCL 0.5 % IJ SOLN
INTRAMUSCULAR | Status: DC | PRN
  Administered 2023-01-18: 4 mL

## 2023-01-18 MED ORDER — ONDANSETRON HCL 4 MG/2ML IJ SOLN: 4.0000 mg | Freq: Four times a day (QID) | INTRAMUSCULAR | Status: DC | PRN

## 2023-01-18 MED ORDER — POLYETHYLENE GLYCOL 3350 17 G PO PACK
17.0000 g | PACK | Freq: Every day | ORAL | Status: DC | PRN
  Administered 2023-01-18: 17 g via ORAL
  Filled 2023-01-18: qty 1

## 2023-01-18 MED ORDER — FLEET ENEMA RE ENEM: 1.0000 | ENEMA | Freq: Once | RECTAL | Status: DC | PRN

## 2023-01-18 MED ORDER — FENTANYL CITRATE (PF) 250 MCG/5ML IJ SOLN
INTRAMUSCULAR | Status: DC | PRN
  Administered 2023-01-18 (×3): 50 ug via INTRAVENOUS

## 2023-01-18 MED ORDER — SODIUM CHLORIDE 0.9% FLUSH
3.0000 mL | Freq: Two times a day (BID) | INTRAVENOUS | Status: DC
  Administered 2023-01-18: 3 mL via INTRAVENOUS

## 2023-01-18 MED ORDER — PROPOFOL 10 MG/ML IV BOLUS
INTRAVENOUS | Status: AC
  Filled 2023-01-18: qty 20

## 2023-01-18 MED ORDER — LIDOCAINE-EPINEPHRINE 1 %-1:100000 IJ SOLN
INTRAMUSCULAR | Status: AC
  Filled 2023-01-18: qty 1

## 2023-01-18 MED ORDER — HYDROCODONE-ACETAMINOPHEN 5-325 MG PO TABS: 2.0000 | ORAL_TABLET | ORAL | Status: DC | PRN

## 2023-01-18 MED ORDER — THROMBIN 5000 UNITS EX SOLR
OROMUCOSAL | Status: DC | PRN
  Administered 2023-01-18: 10 mL via TOPICAL

## 2023-01-18 MED ORDER — THROMBIN 5000 UNITS EX SOLR
CUTANEOUS | Status: AC
  Filled 2023-01-18: qty 5000

## 2023-01-18 MED ORDER — ONDANSETRON HCL 4 MG PO TABS: 4.0000 mg | ORAL_TABLET | Freq: Four times a day (QID) | ORAL | Status: DC | PRN

## 2023-01-18 MED ORDER — SODIUM CHLORIDE 0.9 % IV SOLN: INTRAVENOUS | Status: DC

## 2023-01-18 MED ORDER — FENTANYL CITRATE (PF) 100 MCG/2ML IJ SOLN
25.0000 ug | INTRAMUSCULAR | Status: DC | PRN
  Administered 2023-01-18: 50 ug via INTRAVENOUS

## 2023-01-18 MED ORDER — MENTHOL 3 MG MT LOZG
1.0000 | LOZENGE | OROMUCOSAL | Status: DC | PRN
  Administered 2023-01-19: 3 mg via ORAL
  Filled 2023-01-18 (×3): qty 9

## 2023-01-18 MED ORDER — METHOCARBAMOL 500 MG PO TABS
500.0000 mg | ORAL_TABLET | Freq: Four times a day (QID) | ORAL | Status: DC | PRN
Start: 2023-01-18 — End: 2023-01-19
  Administered 2023-01-18: 500 mg via ORAL
  Filled 2023-01-18: qty 1

## 2023-01-18 MED ORDER — TAMSULOSIN HCL 0.4 MG PO CAPS
0.4000 mg | ORAL_CAPSULE | Freq: Every day | ORAL | Status: DC
  Administered 2023-01-18 – 2023-01-19 (×2): 0.4 mg via ORAL
  Filled 2023-01-18 (×2): qty 1

## 2023-01-18 MED ORDER — KETAMINE HCL 10 MG/ML IJ SOLN
INTRAMUSCULAR | Status: DC | PRN
  Administered 2023-01-18: 30 mg via INTRAVENOUS
  Administered 2023-01-18: 10 mg via INTRAVENOUS

## 2023-01-18 MED ORDER — EPHEDRINE SULFATE-NACL 50-0.9 MG/10ML-% IV SOSY
PREFILLED_SYRINGE | INTRAVENOUS | Status: DC | PRN
  Administered 2023-01-18 (×2): 5 mg via INTRAVENOUS

## 2023-01-18 MED ORDER — SUGAMMADEX SODIUM 200 MG/2ML IV SOLN
INTRAVENOUS | Status: DC | PRN
  Administered 2023-01-18: 200 mg via INTRAVENOUS

## 2023-01-18 MED ORDER — CEFAZOLIN SODIUM-DEXTROSE 2-4 GM/100ML-% IV SOLN
2.0000 g | INTRAVENOUS | Status: AC
  Administered 2023-01-18: 2 g via INTRAVENOUS
  Filled 2023-01-18: qty 100

## 2023-01-18 MED ORDER — ACETAMINOPHEN 650 MG RE SUPP: 650.0000 mg | RECTAL | Status: DC | PRN

## 2023-01-18 MED ORDER — ACETAMINOPHEN 325 MG PO TABS
650.0000 mg | ORAL_TABLET | ORAL | Status: DC | PRN
  Administered 2023-01-19: 650 mg via ORAL
  Filled 2023-01-18: qty 2

## 2023-01-18 MED ORDER — ALBUMIN HUMAN 5 % IV SOLN: INTRAVENOUS | Status: DC | PRN

## 2023-01-18 MED ORDER — ONDANSETRON HCL 4 MG/2ML IJ SOLN
INTRAMUSCULAR | Status: DC | PRN
  Administered 2023-01-18: 4 mg via INTRAVENOUS

## 2023-01-18 MED ORDER — OXYCODONE HCL 5 MG/5ML PO SOLN
5.0000 mg | Freq: Once | ORAL | Status: DC | PRN
Start: 2023-01-18 — End: 2023-01-18

## 2023-01-18 MED ORDER — ORAL CARE MOUTH RINSE: 15.0000 mL | Freq: Once | OROMUCOSAL | Status: AC

## 2023-01-18 MED ORDER — DEXAMETHASONE SODIUM PHOSPHATE 10 MG/ML IJ SOLN
INTRAMUSCULAR | Status: DC | PRN
  Administered 2023-01-18: 10 mg via INTRAVENOUS

## 2023-01-18 MED ORDER — SODIUM CHLORIDE 0.9% FLUSH
3.0000 mL | INTRAVENOUS | Status: DC | PRN
Start: 2023-01-18 — End: 2023-01-19

## 2023-01-18 MED ORDER — ATORVASTATIN CALCIUM 10 MG PO TABS
20.0000 mg | ORAL_TABLET | Freq: Every day | ORAL | Status: DC
  Administered 2023-01-18 – 2023-01-19 (×2): 20 mg via ORAL
  Filled 2023-01-18 (×2): qty 2

## 2023-01-18 MED ORDER — BUPIVACAINE HCL (PF) 0.5 % IJ SOLN
INTRAMUSCULAR | Status: AC
  Filled 2023-01-18: qty 30

## 2023-01-18 MED ORDER — KETOROLAC TROMETHAMINE 15 MG/ML IJ SOLN
7.5000 mg | Freq: Four times a day (QID) | INTRAMUSCULAR | Status: AC
  Administered 2023-01-18 – 2023-01-19 (×4): 7.5 mg via INTRAVENOUS
  Filled 2023-01-18 (×4): qty 1

## 2023-01-18 MED ORDER — CHLORHEXIDINE GLUCONATE 0.12 % MT SOLN
15.0000 mL | Freq: Once | OROMUCOSAL | Status: AC
  Administered 2023-01-18: 15 mL via OROMUCOSAL
  Filled 2023-01-18: qty 15

## 2023-01-18 MED ORDER — PROPOFOL 10 MG/ML IV BOLUS
INTRAVENOUS | Status: DC | PRN
  Administered 2023-01-18: 10 mg via INTRAVENOUS

## 2023-01-18 MED ORDER — ROCURONIUM BROMIDE 10 MG/ML (PF) SYRINGE
PREFILLED_SYRINGE | INTRAVENOUS | Status: DC | PRN
  Administered 2023-01-18: 60 mg via INTRAVENOUS
  Administered 2023-01-18: 10 mg via INTRAVENOUS

## 2023-01-18 MED ORDER — LIDOCAINE-EPINEPHRINE 1 %-1:100000 IJ SOLN
INTRAMUSCULAR | Status: DC | PRN
  Administered 2023-01-18: 4 mL

## 2023-01-18 MED ORDER — 0.9 % SODIUM CHLORIDE (POUR BTL) OPTIME
TOPICAL | Status: DC | PRN
  Administered 2023-01-18: 1000 mL

## 2023-01-18 MED ORDER — SODIUM CHLORIDE 0.9 % IV SOLN: 250.0000 mL | INTRAVENOUS | Status: DC

## 2023-01-18 MED ORDER — METHOCARBAMOL 1000 MG/10ML IJ SOLN: 500.0000 mg | Freq: Four times a day (QID) | INTRAMUSCULAR | Status: DC | PRN

## 2023-01-18 MED ORDER — HYDROCODONE-ACETAMINOPHEN 5-325 MG PO TABS
1.0000 | ORAL_TABLET | ORAL | Status: DC | PRN
Start: 2023-01-18 — End: 2023-01-19

## 2023-01-18 MED ORDER — OXYCODONE HCL 5 MG PO TABS: 5.0000 mg | ORAL_TABLET | Freq: Once | ORAL | Status: DC | PRN

## 2023-01-18 MED ORDER — CEFAZOLIN SODIUM-DEXTROSE 2-4 GM/100ML-% IV SOLN
2.0000 g | Freq: Three times a day (TID) | INTRAVENOUS | Status: AC
  Administered 2023-01-18 (×2): 2 g via INTRAVENOUS
  Filled 2023-01-18 (×2): qty 100

## 2023-01-18 MED ORDER — HYDROMORPHONE HCL 1 MG/ML IJ SOLN: 0.5000 mg | INTRAMUSCULAR | Status: DC | PRN

## 2023-01-18 SURGICAL SUPPLY — 59 items
BAG COUNTER SPONGE SURGICOUNT (BAG) ×2 IMPLANT
BASKET BONE COLLECTION (BASKET) IMPLANT
BENZOIN TINCTURE PRP APPL 2/3 (GAUZE/BANDAGES/DRESSINGS) ×2 IMPLANT
BIT DRILL NEURO 2X3.1 SFT TUCH (MISCELLANEOUS) ×2 IMPLANT
BLADE CLIPPER SURG (BLADE) IMPLANT
BUR MATCHSTICK NEURO 3.0 LAGG (BURR) ×2 IMPLANT
CANISTER SUCT 3000ML PPV (MISCELLANEOUS) ×2 IMPLANT
DEVICE EDNSKLTN TC NNLCK MED 8 (Cage) IMPLANT
DEVICE ENDSKLTN TC NANOLCK 6MM (Cage) IMPLANT
DRAPE C-ARM 42X72 X-RAY (DRAPES) ×4 IMPLANT
DRAPE HALF SHEET 40X57 (DRAPES) IMPLANT
DRAPE LAPAROTOMY 100X72 PEDS (DRAPES) ×2 IMPLANT
DRAPE MICROSCOPE SLANT 54X150 (MISCELLANEOUS) ×2 IMPLANT
DRESSING MEPILEX FLEX 4X4 (GAUZE/BANDAGES/DRESSINGS) ×2 IMPLANT
DRILL NEURO 2X3.1 SOFT TOUCH (MISCELLANEOUS) ×1
DRSG MEPILEX FLEX 4X4 (GAUZE/BANDAGES/DRESSINGS) ×1
DRSG OPSITE 4X5.5 SM (GAUZE/BANDAGES/DRESSINGS) ×4 IMPLANT
DRSG OPSITE POSTOP 4X6 (GAUZE/BANDAGES/DRESSINGS) IMPLANT
DURAPREP 26ML APPLICATOR (WOUND CARE) ×2 IMPLANT
ELECT COATED BLADE 2.86 ST (ELECTRODE) ×2 IMPLANT
ELECT REM PT RETURN 9FT ADLT (ELECTROSURGICAL) ×1
ELECTRODE REM PT RTRN 9FT ADLT (ELECTROSURGICAL) ×2 IMPLANT
ENDOSKELETON TC NANOLOCK 6MM (Cage) ×1 IMPLANT
ENDOSKELTON IMPLANT TC MED 8MM (Cage) ×1 IMPLANT
GAUZE 4X4 16PLY ~~LOC~~+RFID DBL (SPONGE) IMPLANT
GLOVE BIO SURGEON STRL SZ7 (GLOVE) ×4 IMPLANT
GLOVE BIOGEL PI IND STRL 7.5 (GLOVE) ×4 IMPLANT
GLOVE ECLIPSE 7.5 STRL STRAW (GLOVE) ×2 IMPLANT
GOWN STRL REUS W/ TWL LRG LVL3 (GOWN DISPOSABLE) ×4 IMPLANT
GOWN STRL REUS W/ TWL XL LVL3 (GOWN DISPOSABLE) ×2 IMPLANT
GOWN STRL REUS W/TWL 2XL LVL3 (GOWN DISPOSABLE) IMPLANT
GOWN STRL REUS W/TWL LRG LVL3 (GOWN DISPOSABLE) ×3
GOWN STRL REUS W/TWL XL LVL3 (GOWN DISPOSABLE) ×3
HEMOSTAT POWDER KIT SURGIFOAM (HEMOSTASIS) ×2 IMPLANT
KIT BASIN OR (CUSTOM PROCEDURE TRAY) ×2 IMPLANT
KIT TURNOVER KIT B (KITS) ×2 IMPLANT
NDL HYPO 22X1.5 SAFETY MO (MISCELLANEOUS) ×2 IMPLANT
NDL SPNL 22GX3.5 QUINCKE BK (NEEDLE) ×2 IMPLANT
NEEDLE HYPO 22X1.5 SAFETY MO (MISCELLANEOUS) ×1 IMPLANT
NEEDLE SPNL 22GX3.5 QUINCKE BK (NEEDLE) ×1 IMPLANT
NS IRRIG 1000ML POUR BTL (IV SOLUTION) ×2 IMPLANT
PACK LAMINECTOMY NEURO (CUSTOM PROCEDURE TRAY) ×2 IMPLANT
PAD ARMBOARD 7.5X6 YLW CONV (MISCELLANEOUS) ×6 IMPLANT
PIN DISTRACTION 14MM (PIN) IMPLANT
PLATE ZEVO 2LVL 41MM (Plate) IMPLANT
PUTTY DBF 1CC CORTICAL FIBERS (Putty) IMPLANT
SCREW 3.5 SELFDRILL 17MM VARI (Screw) IMPLANT
SPIKE FLUID TRANSFER (MISCELLANEOUS) ×2 IMPLANT
SPONGE INTESTINAL PEANUT (DISPOSABLE) ×2 IMPLANT
STAPLER VISISTAT 35W (STAPLE) IMPLANT
STRIP CLOSURE SKIN 1/2X4 (GAUZE/BANDAGES/DRESSINGS) ×2 IMPLANT
SUT MNCRL AB 4-0 PS2 18 (SUTURE) ×2 IMPLANT
SUT SILK 2 0 TIES 10X30 (SUTURE) IMPLANT
SUT VIC AB 3-0 SH 8-18 (SUTURE) ×2 IMPLANT
TAPE CLOTH 3X10 TAN LF (GAUZE/BANDAGES/DRESSINGS) ×2 IMPLANT
TIP KERRISON THIN FOOTPLATE 2M (MISCELLANEOUS) ×2 IMPLANT
TOWEL GREEN STERILE (TOWEL DISPOSABLE) ×2 IMPLANT
TOWEL GREEN STERILE FF (TOWEL DISPOSABLE) ×2 IMPLANT
WATER STERILE IRR 1000ML POUR (IV SOLUTION) ×2 IMPLANT

## 2023-01-18 NOTE — Anesthesia Procedure Notes (Signed)
Procedure Name: Intubation Date/Time: 01/18/2023 8:20 AM  Performed by: Loleta Camaria Gerald, CRNAPre-anesthesia Checklist: Patient identified, Patient being monitored, Timeout performed, Emergency Drugs available and Suction available Patient Re-evaluated:Patient Re-evaluated prior to induction Oxygen Delivery Method: Circle system utilized Preoxygenation: Pre-oxygenation with 100% oxygen Induction Type: IV induction Ventilation: Mask ventilation without difficulty Laryngoscope Size: Glidescope and 4 Grade View: Grade I Tube type: Oral Tube size: 7.5 mm Number of attempts: 1 Airway Equipment and Method: Stylet Placement Confirmation: ETT inserted through vocal cords under direct vision, positive ETCO2 and breath sounds checked- equal and bilateral Secured at: 23 cm Tube secured with: Tape Dental Injury: Teeth and Oropharynx as per pre-operative assessment  Comments: Intubation by paramedic student

## 2023-01-18 NOTE — Op Note (Signed)
PREOP DIAGNOSIS: Cervical stenosis   POSTOP DIAGNOSIS: Cervical stenosis   PROCEDURE: 1. Arthrodesis C3-4, anterior interbody technique, including Discectomy for decompression of spinal cord and exiting nerve roots with foraminotomies  2. Arthrodesis, additional level C4-5 anterior interbody technique, including Discectomy for decompression of spinal cord and exiting nerve roots with foraminotomies  3. Placement of intervertebral biomechanical device C3-4 4. Placement of intervertebral biomechanical device C4-5 5. Placement of anterior instrumentation consisting of interbody plate and screws C3-4-5 6. Use of morselized bone allograft  7. Use of intraoperative microscope  SURGEON: Dr. Hoyt Koch, MD  ASSISTANT: Patrici Ranks, PA.  Please note, no qualified trainees were available to assist with the procedure.  Assistance was required for retraction of the visceral structures to safely allow for instrumentation.  ANESTHESIA: General Endotracheal  EBL: 25 ml  IMPLANTS: Medtronic 41 mm Zevo plate X3-2: 6 mm Titan-C cage C4-5: 8 mm Titan-C cage 17 mm screws x 6 DBF  SPECIMENS: None  DRAINS: None  COMPLICATIONS: None immediate  CONDITION: Hemodynamically stable to PACU  HISTORY: This is a 77 yo M who developed left cervical radiculopathy symptoms, mostly C5 in distribution.  Risks, benefits, alternatives, and expected convalescence were discussed with the patient.  Risks discussed included but were not limited to bleeding, pain, infection, dysphagia, dysphonia, pseudoarthrosis, hardware failure, adjacent segment disease, CSF leak, neurologic deficits, weakness, numbness, paralysis, coma, and death. After all questions were answered, informed consent was obtained.  PROCEDURE IN DETAIL: The patient was brought to the operating room and transferred to the operative table. After induction of general anesthesia, the patient was positioned on the operative table in the supine  position with all pressure points meticulously padded. The skin of the neck was then prepped and draped in the usual sterile fashion.  After timeout was conducted, the skin was infiltrated with local anesthetic. Skin incision was then made sharply and Bovie electrocautery was used to dissect the subcutaneous tissue until the platysma was identified. The platysma was then divided and undermined. The sternocleidomastoid muscle was then identified and, utilizing natural fascial planes in the neck, the prevertebral fascia was identified and the carotid sheath was retracted laterally and the trachea and esophagus retracted medially. Again using fluoroscopy, the C3-4 disc space was identified. Bovie electrocautery was used to dissect in the subperiosteal plane and elevate the bilateral longus coli muscles. Self-retaining retractors were then placed. Caspar distraction pins were placed in the adjacent bodies to allow for gentle distraction.  At this point, the microscope was draped and brought into the field, and the remainder of the case was done under the microscope using microdissecting technique.  The disc space was severely collapsed.  The disc was incised sharply and combination of high speed drill, curettes, and rongeurs were use to initially complete a discectomy. The high-speed drill was then used to complete discectomy until the posterior annulus was identified and removed and the posterior longitudinal ligament was identified. Using a nerve hook, the PLL was elevated, and Kerrison rongeurs were used to remove the posterior longitudinal ligament and the ventral thecal sac was identified. Using a combination of curettes and rongeurs, complete decompression of the thecal sac and exiting nerve roots at this level was completed, and verified with easy passage of micro-nerve hook centrally and in the bilateral foramina.  Having completed our decompression, attention was turned to placement of the intervertebral  device. Trial spacers were used to select a size 6 mm graft. This graft was then filled with morcellized allograft, and  inserted under live fluoroscopy.  Attention was then turned to the C4-5 level. Caspar distraction pin was placed in the adjacent body to allow for gentle distraction of the disc space.  In a similar fashion, discectomy was completed initially with curettes and rongeurs, and completed with the drill. The PLL was again identified, elevated and incised. Using Kerrison rongeurs, decompression of the spinal cord and exiting roots was completed and confirmed with a dissector. Trial spacers were used to select a 8 mm graft. This graft was then filled with morcellized allograft, and inserted under live fluoroscopy.   After placement of the intervertebral devices, the caspar pins were removed.  An anterior cervical plate was placed across the interspaces for anterior fixation.  Using a high-speed drill, the cortex of the cervical vertebral bodies was punctured, and screws inserted in the vertebral bodies. Final fluoroscopic images in AP and lateral projections were taken to confirm good hardware placement.  At this point, after all counts were verified to be correct, meticulous hemostasis was secured using a combination of bipolar electrocautery and passive hemostatics. The platysma muscle was then closed using interrupted 3-0 Vicryl sutures, and the skin was closed with a 4-0 monocryl in subcutical fashion. Sterile dressings were then applied and the drapes removed.  The patient tolerated the procedure well and was extubated in the room and taken to the postanesthesia care unit in stable condition.  All counts were correct at the end of the procedure.

## 2023-01-18 NOTE — Progress Notes (Signed)
Orthopedic Tech Progress Note Patient Details:  [name] 05-04-1945 [id number]  Ortho Devices Type of Ortho Device: Soft collar Ortho Device/Splint Location: Neck Ortho Device/Splint Interventions: Application, Ordered, Adjustment   Post Interventions Patient Tolerated: Well  [name] A Evalee Gerard 01/18/2023, 12:38 PM

## 2023-01-18 NOTE — Transfer of Care (Signed)
Immediate Anesthesia Transfer of Care Note  Patient: [name]  Procedure(s) Performed: Anterior Cervical Decompression Fusion  Cervical three-four, Cervical four-five  Patient Location: PACU  Anesthesia Type:General  Level of Consciousness: awake  Airway & Oxygen Therapy: Patient Spontanous Breathing  Post-op Assessment: Report given to RN and Post -op Vital signs reviewed and stable  Post vital signs: Reviewed and stable  Last Vitals:  Vitals Value Taken Time  BP 147/67 01/18/23 1051  Temp    Pulse 87 01/18/23 1055  Resp 14 01/18/23 1055  SpO2 92 % 01/18/23 1055  Vitals shown include unfiled device data.  Last Pain:  Vitals:   01/18/23 0615  TempSrc:   PainSc: 1       Patients Stated Pain Goal: 0 (01/18/23 0615)  Complications: No notable events documented.

## 2023-01-18 NOTE — H&P (Signed)
CC: Neck pain, left arm pain   HPI:     Patient is a 77 y.o. male presents with neck pain and left arm pain, was found to have severe cervical stenosis.    Patient Active Problem List   Diagnosis Date Noted   Family history of premature CAD 08/19/2018   Hyperlipidemia 08/19/2018   Kidney stones 02/09/2013   Acute renal insufficiency 02/09/2013   Anxiety    PNEUMONIA 11/03/2008   Past Medical History:  Diagnosis Date   Abnormal MRI    BRAIN   Acute renal insufficiency 02/09/2013   Anxiety    Arthritis    HSV (herpes simplex virus) infection    Hyperlipidemia    Kidney stones 02/09/2013   Nephrolithiasis    Weight loss     Past Surgical History:  Procedure Laterality Date   REFRACTIVE SURGERY  1998   URETERAL STENT PLACEMENT  2014    Facility-Administered Medications Prior to Admission  Medication Dose Route Frequency Provider Last Rate Last Admin   0.9 %  sodium chloride infusion  500 mL Intravenous Continuous Meryl Dare, MD       Medications Prior to Admission  Medication Sig Dispense Refill Last Dose   aspirin 81 MG tablet Take 81 mg by mouth 2 (two) times a week.   Past Month   atorvastatin (LIPITOR) 20 MG tablet Take 1 tablet (20 mg total) by mouth daily. 90 tablet 3 01/16/2023   sildenafil (REVATIO) 20 MG tablet TAKE 3 TO 5 TABLETS AS NEEDED PRIOR TO SEXUAL ACTIVITY 50 tablet 0 Past Month   tamsulosin (FLOMAX) 0.4 MG CAPS capsule Take 0.4 mg by mouth daily.   01/15/2023   trolamine salicylate (ASPERCREME) 10 % cream Apply 1 Application topically as needed for muscle pain.   01/11/2023   hydroxypropyl methylcellulose / hypromellose (ISOPTO TEARS / GONIOVISC) 2.5 % ophthalmic solution Place 1 drop into both eyes as needed for dry eyes.   More than a month   valACYclovir (VALTREX) 1000 MG tablet Take 1,000 mg by mouth daily as needed (fever blisters).   More than a month   No Known Allergies  Social History   Tobacco Use   Smoking status: Never    Smokeless tobacco: Never  Substance Use Topics   Alcohol use: Yes    Alcohol/week: 7.0 standard drinks of alcohol    Types: 7 Shots of liquor per week    Comment: one drink a day    Family History  Problem Relation Age of Onset   CAD Mother    Melanoma Father    Stroke Sister    Breast cancer Paternal Grandmother    High blood pressure Brother    Colon cancer Neg Hx      Review of Systems Pertinent items are noted in HPI.  Objective:   Patient Vitals for the past 8 hrs:  BP Temp Temp src Pulse Resp SpO2 Height Weight  01/18/23 0555 (!) 140/77 97.6 F (36.4 C) Oral 75 17 98 % 6\' 2"  (1.88 m) 78.5 kg   No intake/output data recorded. No intake/output data recorded.      General : Alert, cooperative, no distress, appears stated age   Head:  Normocephalic/atraumatic    Eyes: PERRL, conjunctiva/corneas clear, EOM's intact. Fundi could not be visualized Neck: Supple Chest:  Respirations unlabored Chest wall: no tenderness or deformity Heart: Regular rate and rhythm Abdomen: Soft, nontender and nondistended Extremities: warm and well-perfused Skin: normal turgor, color and texture Neurologic:  Alert,  oriented x 3.  Eyes open spontaneously. PERRL, EOMI, VFC, no facial droop. V1-3 intact.  No dysarthria, tongue protrusion symmetric.  CNII-XII intact. Normal strength, sensation and reflexes throughout.  No pronator drift, full strength in legs.  + L Spurling's       Data ReviewCBC:  Lab Results  Component Value Date   WBC 5.0 01/11/2023   RBC 4.59 01/11/2023   BMP:  Lab Results  Component Value Date   GLUCOSE 123 (H) 01/11/2023   CO2 27 01/11/2023   BUN 26 (H) 01/11/2023   CREATININE 1.06 01/11/2023   CREATININE 1.13 06/26/2013   CALCIUM 9.9 01/11/2023   Radiology review:  See clinic note  Assessment:   Active Problems:   * No active hospital problems. *  C3-4, C4-5 stenosis  Plan:   ACDFs today - Risks, benefits, alternatives, and expected  convalescence were discussed with the patient.  Risks discussed included, but were not limited to bleeding, pain, infection, dysphagia, dysphonia, scar, spinal fluid leak, neurologic deficit, instability, pseudoarthrosis, adjacent segment disease, damage to nearby organs, and death.  Informed consent was obtained.

## 2023-01-19 DIAGNOSIS — M4802 Spinal stenosis, cervical region: Secondary | ICD-10-CM | POA: Diagnosis not present

## 2023-01-19 MED ORDER — METHOCARBAMOL 500 MG PO TABS: 500.0000 mg | ORAL_TABLET | Freq: Four times a day (QID) | ORAL | 0 refills | Status: AC | PRN

## 2023-01-19 MED ORDER — HYDROCODONE-ACETAMINOPHEN 5-325 MG PO TABS: 1.0000 | ORAL_TABLET | Freq: Four times a day (QID) | ORAL | 0 refills | Status: AC | PRN

## 2023-01-19 MED ORDER — DOCUSATE SODIUM 100 MG PO CAPS: 100.0000 mg | ORAL_CAPSULE | Freq: Two times a day (BID) | ORAL | 0 refills | Status: AC

## 2023-01-19 NOTE — Anesthesia Postprocedure Evaluation (Signed)
Anesthesia Post Note  Patient: [name]  Procedure(s) Performed: Anterior Cervical Decompression Fusion  Cervical three-four, Cervical four-five     Patient location during evaluation: PACU Anesthesia Type: General Level of consciousness: awake and alert Pain management: pain level controlled Vital Signs Assessment: post-procedure vital signs reviewed and stable Respiratory status: spontaneous breathing, nonlabored ventilation, respiratory function stable and patient connected to nasal cannula oxygen Cardiovascular status: blood pressure returned to baseline and stable Postop Assessment: no apparent nausea or vomiting Anesthetic complications: no   No notable events documented.  Last Vitals:  Vitals:   01/19/23 0503 01/19/23 0711  BP: 125/62 117/62  Pulse: (!) 58 63  Resp: 16 20  Temp: 36.7 C 37.1 C  SpO2: 98% 96%    Last Pain:  Vitals:   01/19/23 0711  TempSrc: Oral  PainSc:                  Nelle Don Humaira Sculley

## 2023-01-19 NOTE — Progress Notes (Signed)
PT Cancellation Note and Discharge  Patient Details Name: [name] MRN: [id number] DOB: 02-20-1946   Cancelled Treatment:    Reason Eval/Treat Not Completed: PT screened, no needs identified, will sign off. Discussed pt case with OT who reports pt is currently mobilizing at a modified independent level and does not require a formal PT evaluation at this time. PT signing off. If needs change, please reconsult.     Marylynn Pearson 01/19/2023, 9:57 AM  Conni Slipper, PT, DPT Acute Rehabilitation Services Secure Chat Preferred Office: [phone]

## 2023-01-19 NOTE — Evaluation (Signed)
Occupational Therapy Evaluation Patient Details Name: [name] MRN: [id number] DOB: 07/25/1945 Today's Date: 01/19/2023   History of Present Illness [name] is a 77 yo male who underwent Anterior Cervical Decompression Fusion  Cervical three-four, Cervical four-five 11/14. PMHx: anxiety, arthritis, HSV, HLD   Clinical Impression   [name] was evaluated s/p the above spine surgery. He is indep and lives alone at baseline. Upon evaluation pt was limited by cervical precautions, surgical pain and decreased activity tolerance. Overall he demonstrated mod I ability to complete ADLs and mobility with RW. Pt benefitted from cues for upright posture throughout. Provided cues and education on spinal precautions and compensatory techniques throughout, handout provided and pt demonstrated good recall during ADLs and mobility. Pt does not require further acute OT services. Recommend d/c home with support of family.         If plan is discharge home, recommend the following: Assistance with cooking/housework;Assist for transportation    Functional Status Assessment  Patient has had a recent decline in their functional status and demonstrates the ability to make significant improvements in function in a reasonable and predictable amount of time.  Equipment Recommendations  None recommended by OT       Precautions / Restrictions Precautions Precautions: Cervical;Fall Precaution Booklet Issued: Yes (comment) Required Braces or Orthoses: Cervical Brace Cervical Brace: Soft collar;At all times      Mobility Bed Mobility Overal bed mobility: Needs Assistance Bed Mobility: Rolling, Sidelying to Sit Rolling: Modified independent (Device/Increase time) Sidelying to sit: Modified independent (Device/Increase time)       General bed mobility comments: log roll    Transfers Overall transfer level: Needs assistance Equipment used: Rolling walker (2 wheels) Transfers: Sit  to/from Stand Sit to Stand: Modified independent (Device/Increase time)                  Balance Overall balance assessment: Mild deficits observed, not formally tested             ADL either performed or assessed with clinical judgement   ADL Overall ADL's : Modified independent         General ADL Comments: After review of spinal precautions and compensatory techniques pts demonstrated mod I ability to complete ADLs and mobility with RW     Vision Baseline Vision/History: 1 Wears glasses Vision Assessment?: No apparent visual deficits     Perception Perception: Within Functional Limits       Praxis Praxis: WFL       Pertinent Vitals/Pain Pain Assessment Pain Assessment: Faces Faces Pain Scale: Hurts a little bit Pain Location: throat Pain Descriptors / Indicators: Discomfort Pain Intervention(s): Limited activity within patient's tolerance, Monitored during session     Extremity/Trunk Assessment     Lower Extremity Assessment Lower Extremity Assessment: Overall WFL for tasks assessed   Cervical / Trunk Assessment Cervical / Trunk Assessment: Normal   Communication Communication Communication: No apparent difficulties   Cognition Arousal: Alert Behavior During Therapy: WFL for tasks assessed/performed Overall Cognitive Status: Within Functional Limits for tasks assessed             General Comments: Needs cues to stand upright during functional tasks and mobility     General Comments  VSS on RA, son present    Exercises     Shoulder Instructions      Home Living Family/patient expects to be discharged to:: Private residence Living Arrangements: Alone Available Help at Discharge: Family;Available 24 hours/day Type of Home: House Home Access:  Stairs to enter [employer] of Steps: 3 Entrance Stairs-Rails: Right Home Layout: Two level;Able to live on main level with bedroom/bathroom Alternate Level Stairs-Number of Steps:  flight Alternate Level Stairs-Rails: Left Bathroom Shower/Tub: Producer, television/film/video: Handicapped height     Home Equipment: None          Prior Functioning/Environment Prior Level of Function : Independent/Modified Independent;Driving                        OT Problem List: Decreased knowledge of precautions         OT Goals(Current goals can be found in the care plan section) Acute Rehab OT Goals Patient Stated Goal: home OT Goal Formulation: With patient Time For Goal Achievement: 02/02/23 Potential to Achieve Goals: Good   AM-PAC OT "6 Clicks" Daily Activity     Outcome Measure Help from another person eating meals?: None Help from another person taking care of personal grooming?: None Help from another person toileting, which includes using toliet, bedpan, or urinal?: None Help from another person bathing (including washing, rinsing, drying)?: None Help from another person to put on and taking off regular upper body clothing?: None Help from another person to put on and taking off regular lower body clothing?: None 6 Click Score: 24   End of Session Equipment Utilized During Treatment: Rolling walker (2 wheels);Cervical collar Nurse Communication: Mobility status  Activity Tolerance: Patient tolerated treatment well Patient left: in bed;with call bell/phone within reach;with family/visitor present  OT Visit Diagnosis: Other abnormalities of gait and mobility (R26.89)                Time: 4132-4401 OT Time Calculation (min): 40 min Charges:  OT General Charges $OT Visit: 1 Visit OT Evaluation $OT Eval Moderate Complexity: 1 Mod OT Treatments $Self Care/Home Management : 23-37 mins  Derenda Mis, OTR/L Acute Rehabilitation Services Office [phone] Secure Chat Communication Preferred   Donia Pounds 01/19/2023, 9:30 AM

## 2023-01-19 NOTE — Discharge Summary (Signed)
Patient ID: [name] MRN: [id number] DOB/AGE: 77/30/1947 77 y.o.  Admit date: 01/18/2023 Discharge date: 01/19/2023  Admission Diagnoses: C3-4, C4-5 stenosis   Discharge Diagnoses: C3-4, C4-5 stenosis    Discharged Condition: Stable  Hospital Course:  [name] is a 77 y.o. male who was admitted following an uncomplicated ACDF C3-5. They were recovered in PACU and transferred to [facility]. Hospital course was uncomplicated. Pt stable for discharge today. Pt to f/u in office for routine post op visit. Pt is in agreement w/ plan.    Discharge Exam: Blood pressure 117/62, pulse 63, temperature 98.8 F (37.1 C), temperature source Oral, resp. rate 20, height 6\' 2"  (1.88 m), weight 78.5 kg, SpO2 96%. A&O x3 Speech fluent, appropriate Strength 5/5 x4.  SILTx4.  Dressing c/d/I.   Disposition: Discharge disposition: 01-Home or Self Care       Discharge Instructions     Incentive spirometry RT   Complete by: As directed       Allergies as of 01/19/2023   No Known Allergies      Medication List     TAKE these medications    aspirin 81 MG tablet Take 1 tablet (81 mg total) by mouth 2 (two) times a week. Start taking on: January 25, 2023 What changed: These instructions start on January 25, 2023. If you are unsure what to do until then, ask your doctor or other care provider.   atorvastatin 20 MG tablet Commonly known as: LIPITOR Take 1 tablet (20 mg total) by mouth daily.   docusate sodium 100 MG capsule Commonly known as: COLACE Take 1 capsule (100 mg total) by mouth 2 (two) times daily.   HYDROcodone-acetaminophen 5-325 MG tablet Commonly known as: NORCO/VICODIN Take 1 tablet by mouth every 6 (six) hours as needed for moderate pain (pain score 4-6) ((score 4 to 6)).   hydroxypropyl methylcellulose / hypromellose 2.5 % ophthalmic solution Commonly known as: ISOPTO TEARS / GONIOVISC Place 1 drop into both eyes as needed for dry eyes.    methocarbamol 500 MG tablet Commonly known as: ROBAXIN Take 1 tablet (500 mg total) by mouth every 6 (six) hours as needed for muscle spasms.   sildenafil 20 MG tablet Commonly known as: REVATIO TAKE 3 TO 5 TABLETS AS NEEDED PRIOR TO SEXUAL ACTIVITY   tamsulosin 0.4 MG Caps capsule Commonly known as: FLOMAX Take 0.4 mg by mouth daily.   trolamine salicylate 10 % cream Commonly known as: ASPERCREME Apply 1 Application topically as needed for muscle pain.   valACYclovir 1000 MG tablet Commonly known as: VALTREX Take 1,000 mg by mouth daily as needed (fever blisters).               Durable Medical Equipment  (From admission, onward)           Start     Ordered   01/19/23 1012  For home use only DME Walker rolling  Once       Question Answer Comment  Walker: With 5 Inch Wheels   Patient needs a walker to treat with the following condition Unsteady gait when walking      01/19/23 1013            Follow-up Information     Bedelia Person, MD. Call.   Specialty: Neurosurgery Why: As needed, If symptoms worsen Contact information: [address] [place] [address] [phone]  Signed: Clovis Riley 01/19/2023, 10:28 AM

## 2023-01-19 NOTE — Progress Notes (Signed)
 Patient alert and oriented, void, ambulate. Surgical site clean and dry no sign of infection. D/c instructions explain and given to the patient all questions answered.

## 2023-01-19 NOTE — Discharge Instructions (Signed)
Wound Care Keep incision covered and dry until post op day 3. You may remove the Honeycomb dressing on post op day 3. Leave steri-strips on back.  They will fall off by themselves. Do not put any creams, lotions, or ointments on incision. You are fine to shower. Let water run over incision and pat dry.  Activity Activity Walk each and every day, increasing distance each day. No lifting greater than 8 lbs.  No lifting no bending no twisting no driving or riding a car unless coming back and forth to see the doctor. If provided with back brace, wear when out of bed.  It is not necessary to wear brace in bed.  Diet Resume your normal diet.   Call Your Doctor If Any of These Occur Redness, drainage, or swelling at the wound.  Temperature greater than 101 degrees. Severe pain not relieved by pain medication. Incision starts to come apart.  Follow Up Appt Call [phone] if you have one or any problem.

## 2023-01-23 ENCOUNTER — Encounter (HOSPITAL_COMMUNITY): Payer: Self-pay | Admitting: Neurosurgery

## 2023-07-10 ENCOUNTER — Encounter: Payer: Self-pay | Admitting: Gastroenterology

## 2023-07-26 ENCOUNTER — Ambulatory Visit (INDEPENDENT_AMBULATORY_CARE_PROVIDER_SITE_OTHER): Admitting: Gastroenterology

## 2023-07-26 ENCOUNTER — Encounter: Payer: Self-pay | Admitting: Gastroenterology

## 2023-07-26 VITALS — BP 130/80 | HR 85 | Ht 74.0 in | Wt 168.0 lb

## 2023-07-26 DIAGNOSIS — R1310 Dysphagia, unspecified: Secondary | ICD-10-CM

## 2023-07-26 DIAGNOSIS — Z860101 Personal history of adenomatous and serrated colon polyps: Secondary | ICD-10-CM

## 2023-07-26 DIAGNOSIS — R1319 Other dysphagia: Secondary | ICD-10-CM

## 2023-07-26 NOTE — Progress Notes (Signed)
 [name] [id number] 1945/06/27   Chief Complaint: Trouble swallowing  Referring Provider: Efrain Grant, PA-C Primary GI MD: Para Bold (previous Dr. Sandrea Cruel)  HPI: [name] is a 78 y.o. male with past medical history of anxiety, HLD, nephrolithiasis s/p ureteral stent placement 2014, adenomatous colon polyps who presents today for a complaint of dysphagia.    Patient states he has been having trouble swallowing solids, particularly steak and other tough meats.  Recently ordered a large steak at a restaurant and after one bite it became stuck in his throat and he had to go to the bathroom to regurgitate/induce vomiting to get it up.  Lately, this has been happening anytime he eats tough meats and has only been able to eat chicken and fish.  Sometimes food gets stuck and he has to regurgitate, other times he can get it to go down with warm liquids.  He finds that cold drinks, particularly those with ice, make it more difficult to swallow solid food.  He denies any difficulty in swallowing liquids.  Patient denies pain with swallowing.  Denies acid reflux, heartburn, abdominal pain, nausea, vomiting.   He reports that about 5 years ago, while in [place]  where he lives part-time, he had a swallowing study which was inconclusive.  He denies prior EGD.  Was having swallowing problems back then, but feels that his problems have worsened over the years.  Patient has regular bowel movements.  States he got sick recently after eating oysters, and since then has had some constipation which is relieved with dietary changes, increased dietary fiber.  Denies any blood in stool or melena.  He has history of an anterior cervical decompression fusion on 01/2023, and reports losing about 15 pounds in the months following surgery.  He had decreased appetite which is improving, states he gained about 3 pounds in the last couple weeks.  He was due for repeat colonoscopy in 2022, but is  not interested in pursuing colonoscopy at this time.  Planning for cataract surgery in June.  Strong family history in parents and siblings of stroke and cardiac disease.  Patient himself has never had an MI/CVA and undergoes annual evaluation with his cardiologist.  He is not a smoker.  Stays active, is on statin therapy.  Previous GI Procedures/Imaging   Colonoscopy 11/15/2015 - Two 6 to 8 mm polyps in the sigmoid colon, removed with a cold snare. Resected and retrieved.  - The examination was otherwise normal on direct and retroflexion views. - 5 year recall Path: Surgical [P], sigmoid, polyp (2) - TUBULAR ADENOMA(S). - HIGH GRADE DYSPLASIA IS NOT IDENTIFIED.  CT A/P 01/30/2013 - 4 mm stone in the distal right ureter with moderate proximal  obstruction.   Flexible sigmoidoscopy 01/10/99 - Normal  Past Medical History:  Diagnosis Date   Abnormal MRI    BRAIN   Acute renal insufficiency 02/09/2013   Anxiety    Arthritis    HSV (herpes simplex virus) infection    Hyperlipidemia    Kidney stones 02/09/2013   Nephrolithiasis    Weight loss     Past Surgical History:  Procedure Laterality Date   ANTERIOR CERVICAL DECOMP/DISCECTOMY FUSION N/A 01/18/2023   Procedure: Anterior Cervical Decompression Fusion  Cervical three-four, Cervical four-five;  Surgeon: Van Gelinas, MD;  Location: [facility] OR;  Service: Neurosurgery;  Laterality: N/A;   REFRACTIVE SURGERY  1998   URETERAL STENT PLACEMENT  2014    Current Outpatient Medications  Medication Sig Dispense Refill  aspirin 81 MG tablet Take 1 tablet (81 mg total) by mouth 2 (two) times a week.     atorvastatin  (LIPITOR) 20 MG tablet Take 1 tablet (20 mg total) by mouth daily. 90 tablet 3   docusate sodium  (COLACE) 100 MG capsule Take 1 capsule (100 mg total) by mouth 2 (two) times daily. 10 capsule 0   HYDROcodone -acetaminophen  (NORCO/VICODIN) 5-325 MG tablet Take 1 tablet by mouth every 6 (six) hours as needed for moderate  pain (pain score 4-6) ((score 4 to 6)). 20 tablet 0   hydroxypropyl methylcellulose / hypromellose (ISOPTO TEARS / GONIOVISC) 2.5 % ophthalmic solution Place 1 drop into both eyes as needed for dry eyes.     methocarbamol  (ROBAXIN ) 500 MG tablet Take 1 tablet (500 mg total) by mouth every 6 (six) hours as needed for muscle spasms. 120 tablet 0   sildenafil  (REVATIO ) 20 MG tablet TAKE 3 TO 5 TABLETS AS NEEDED PRIOR TO SEXUAL ACTIVITY 50 tablet 0   tamsulosin  (FLOMAX ) 0.4 MG CAPS capsule Take 0.4 mg by mouth daily.     trolamine salicylate (ASPERCREME) 10 % cream Apply 1 Application topically as needed for muscle pain.     valACYclovir  (VALTREX ) 1000 MG tablet Take 1,000 mg by mouth daily as needed (fever blisters).     Current Facility-Administered Medications  Medication Dose Route Frequency Provider Last Rate Last Admin   0.9 %  sodium chloride  infusion  500 mL Intravenous Continuous Asencion Blacksmith, MD        Allergies as of 07/26/2023   (No Known Allergies)    Family History  Problem Relation Age of Onset   CAD Mother    Melanoma Father    Stroke Sister    Breast cancer Paternal Grandmother    High blood pressure Brother    Colon cancer Neg Hx     Social History   Tobacco Use   Smoking status: Never   Smokeless tobacco: Never  Vaping Use   Vaping status: Never Used  Substance Use Topics   Alcohol use: Yes    Alcohol/week: 7.0 standard drinks of alcohol    Types: 7 Shots of liquor per week    Comment: one drink a day   Drug use: No     Review of Systems:    Constitutional: No unexplained weight loss, fever, chills, weakness or fatigue Eyes: No change in vision Ears, Nose, Throat:  No change in hearing or congestion Skin: No rash or itching Cardiovascular: No chest pain, chest pressure or palpitations   Respiratory: No SOB or cough Gastrointestinal: See HPI and otherwise negative Genitourinary: No dysuria or change in urinary frequency Neurological: No headache,  dizziness or syncope Musculoskeletal: No new muscle or joint pain Hematologic: No bleeding or bruising    Physical Exam:  Vital signs: BP 130/80   Pulse 85   Ht 6\' 2"  (1.88 m)   Wt 168 lb (76.2 kg)   SpO2 98%   BMI 21.57 kg/m    Constitutional: NAD, Well developed, Well nourished, alert and cooperative Head:  Normocephalic and atraumatic.  Eyes: No scleral icterus. Conjunctiva pink. Mouth: No oral lesions. Respiratory: Respirations even and unlabored. Lungs clear to auscultation bilaterally.  No wheezes, crackles, or rhonchi.  Cardiovascular:  Regular rate and rhythm. No murmurs. No peripheral edema. Gastrointestinal:  Soft, nondistended, nontender. No rebound or guarding. Normal bowel sounds. No appreciable masses or hepatomegaly. Rectal:  Not performed.  Neurologic:  Alert and oriented x4;  grossly normal neurologically.  Skin:   Dry and intact without significant lesions or rashes. Psychiatric: Oriented to person, place and time. Demonstrates good judgement and reason without abnormal affect or behaviors.   RELEVANT LABS AND IMAGING: CBC    Component Value Date/Time   WBC 5.0 01/11/2023 1100   RBC 4.59 01/11/2023 1100   HGB 14.8 01/11/2023 1100   HCT 43.4 01/11/2023 1100   PLT 199 01/11/2023 1100   MCV 94.6 01/11/2023 1100   MCV 96.5 07/13/2012 0848   MCH 32.2 01/11/2023 1100   MCHC 34.1 01/11/2023 1100   RDW 13.2 01/11/2023 1100   LYMPHSABS 0.7 01/30/2013 0305   MONOABS 0.6 01/30/2013 0305   EOSABS 0.0 01/30/2013 0305   BASOSABS 0.0 01/30/2013 0305    CMP     Component Value Date/Time   NA 139 01/11/2023 1100   K 4.5 01/11/2023 1100   CL 107 01/11/2023 1100   CO2 27 01/11/2023 1100   GLUCOSE 123 (H) 01/11/2023 1100   BUN 26 (H) 01/11/2023 1100   CREATININE 1.06 01/11/2023 1100   CREATININE 1.13 06/26/2013 1018   CALCIUM  9.9 01/11/2023 1100   PROT 6.8 01/11/2023 1100   PROT 6.2 12/14/2022 0728   ALBUMIN  3.9 01/11/2023 1100   ALBUMIN  4.2 12/14/2022  0728   AST 25 01/11/2023 1100   ALT 24 01/11/2023 1100   ALKPHOS 74 01/11/2023 1100   BILITOT 0.8 01/11/2023 1100   BILITOT 0.6 12/14/2022 0728   GFRNONAA >60 01/11/2023 1100   GFRAA 50 (L) 01/30/2013 0305   Echocardiogram 11/27/2017 - Left ventricle: The cavity size was normal. Systolic function was    normal. The estimated ejection fraction was in the range of 60%    to 65%. Wall motion was normal; there were no regional wall    motion abnormalities. Left ventricular diastolic function    parameters were normal.  - Aortic valve: There was no regurgitation.  - Mitral valve: There was trivial regurgitation.  - Right ventricle: Systolic function was normal.  - Tricuspid valve: There was mild regurgitation.  - Pulmonary arteries: Systolic pressure was within the normal    range.  - Inferior vena cava: The vessel was normal in size. The    respirophasic diameter changes were in the normal range (= 50%),    consistent with normal central venous pressure.   Assessment/Plan:   Esophageal dysphagia Patient with symptoms of esophageal dysphagia, worsening over the last couple years.  Having episodes of food getting stuck, particularly meats.  No difficulty swallowing liquids, though cold drinks seem to make it more difficult for him to swallow solids.  No prior EGD, but reports having an inconclusive swallow study about 5 years ago in [place] . - Schedule EGD with possible dilation in [facility] (with Dr. Brice Campi per patient request). I thoroughly discussed the procedure with the patient to include nature of the procedure, alternatives, benefits, and risks (including but not limited to bleeding, infection, perforation, anesthesia/cardiac/pulmonary complications). Patient verbalized understanding and gave verbal consent to proceed with procedure.   History of adenomatous colon polyps Last colonoscopy with Dr. Sandrea Cruel in 2017, 2 adenomatous polyps removed, recommended recall 5 years. - Patient not  interested in scheduling colonoscopy at this time. Can revisit at follow up.     Valiant Gaul, PA-C [facility] 07/26/2023, 9:57 AM  Patient Care Team: Doyle, Patricia K, PA-C as PCP - General (Physician Assistant) Arnoldo Lapping, MD as PCP - Cardiology (Cardiology) Love, Hoy Mackintosh, MD (Neurology) Jacinta Martinis, PhD (Psychology)

## 2023-07-26 NOTE — Patient Instructions (Signed)
 You have been scheduled for an endoscopy. Please follow written instructions given to you at your visit today.  If you use inhalers (even only as needed), please bring them with you on the day of your procedure.  If you take any of the following medications, they will need to be adjusted prior to your procedure:   DO NOT TAKE 7 DAYS PRIOR TO TEST- Trulicity (dulaglutide) Ozempic, Wegovy (semaglutide) Mounjaro (tirzepatide) Bydureon Bcise (exanatide extended release)  DO NOT TAKE 1 DAY PRIOR TO YOUR TEST Rybelsus (semaglutide) Adlyxin (lixisenatide) Victoza (liraglutide) Byetta (exanatide) ___________________________________________________________________________   Due to recent changes in healthcare laws, you may see the results of your imaging and laboratory studies on MyChart before your provider has had a chance to review them.  We understand that in some cases there may be results that are confusing or concerning to you. Not all laboratory results come back in the same time frame and the provider may be waiting for multiple results in order to interpret others.  Please give us  48 hours in order for your provider to thoroughly review all the results before contacting the office for clarification of your results.   Thank you for choosing me and [facility].  Valiant Gaul , PA-C

## 2023-07-27 NOTE — Progress Notes (Signed)
 Attending Physician's Attestation   I have reviewed the chart.   I agree with the Advanced Practitioner's note, impression, and recommendations with any updates as below.    Corliss Parish, MD [facility] Advanced Endoscopy Office # [phone]

## 2023-08-13 ENCOUNTER — Ambulatory Visit: Payer: Self-pay | Admitting: Surgery

## 2023-09-14 ENCOUNTER — Telehealth: Payer: Self-pay | Admitting: Cardiovascular Disease

## 2023-09-14 DIAGNOSIS — E785 Hyperlipidemia, unspecified: Secondary | ICD-10-CM

## 2023-09-14 DIAGNOSIS — E782 Mixed hyperlipidemia: Secondary | ICD-10-CM

## 2023-09-14 DIAGNOSIS — Z8249 Family history of ischemic heart disease and other diseases of the circulatory system: Secondary | ICD-10-CM

## 2023-09-14 NOTE — Telephone Encounter (Signed)
 Patient wants to get orders for a VAS US  VASCUSCREEN test.

## 2023-09-14 NOTE — Telephone Encounter (Signed)
 Orders have been placed.

## 2023-09-14 NOTE — Telephone Encounter (Signed)
 Yes - Vascuscreen study is fine. Thank you

## 2023-09-18 ENCOUNTER — Other Ambulatory Visit: Payer: Self-pay | Admitting: Gastroenterology

## 2023-09-18 ENCOUNTER — Ambulatory Visit (AMBULATORY_SURGERY_CENTER): Admitting: Gastroenterology

## 2023-09-18 ENCOUNTER — Encounter: Payer: Self-pay | Admitting: Gastroenterology

## 2023-09-18 VITALS — BP 131/73 | HR 62 | Temp 98.2°F | Resp 14 | Ht 74.0 in | Wt 168.0 lb

## 2023-09-18 DIAGNOSIS — K259 Gastric ulcer, unspecified as acute or chronic, without hemorrhage or perforation: Secondary | ICD-10-CM | POA: Diagnosis not present

## 2023-09-18 DIAGNOSIS — K209 Esophagitis, unspecified without bleeding: Secondary | ICD-10-CM

## 2023-09-18 DIAGNOSIS — K449 Diaphragmatic hernia without obstruction or gangrene: Secondary | ICD-10-CM

## 2023-09-18 DIAGNOSIS — R1319 Other dysphagia: Secondary | ICD-10-CM

## 2023-09-18 DIAGNOSIS — K2950 Unspecified chronic gastritis without bleeding: Secondary | ICD-10-CM | POA: Diagnosis not present

## 2023-09-18 DIAGNOSIS — K2270 Barrett's esophagus without dysplasia: Secondary | ICD-10-CM

## 2023-09-18 MED ORDER — SODIUM CHLORIDE 0.9 % IV SOLN: 500.0000 mL | Freq: Once | INTRAVENOUS | Status: DC

## 2023-09-18 MED ORDER — SUCRALFATE 1 GM/10ML PO SUSP: 1.0000 g | Freq: Two times a day (BID) | ORAL | 0 refills | Status: DC

## 2023-09-18 MED ORDER — OMEPRAZOLE 40 MG PO CPDR: 40.0000 mg | DELAYED_RELEASE_CAPSULE | Freq: Two times a day (BID) | ORAL | 3 refills | Status: AC

## 2023-09-18 NOTE — Op Note (Signed)
 [facility] Patient Name: [name] Procedure Date: 09/18/2023 11:38 AM MRN: [id number] Endoscopist: Aloha Finner , MD, [id number] Age: 78 Referring MD:  Date of Birth: 1945-03-26 Gender: Male Account #: [id number] Procedure:                Upper GI endoscopy Indications:              Dysphagia Medicines:                Monitored Anesthesia Care Procedure:                Pre-Anesthesia Assessment:                           - Prior to the procedure, a History and Physical                            was performed, and patient medications and                            allergies were reviewed. The patient's tolerance of                            previous anesthesia was also reviewed. The risks                            and benefits of the procedure and the sedation                            options and risks were discussed with the patient.                            All questions were answered, and informed consent                            was obtained. Prior Anticoagulants: The patient has                            taken no anticoagulant or antiplatelet agents                            except for aspirin. ASA Grade Assessment: II - A                            patient with mild systemic disease. After reviewing                            the risks and benefits, the patient was deemed in                            satisfactory condition to undergo the procedure.                           After obtaining informed consent, the endoscope was  passed under direct vision. Throughout the                            procedure, the patient's blood pressure, pulse, and                            oxygen saturations were monitored continuously. The                            Olympus Scope [id number] was introduced through the                            mouth, and advanced to the second part of duodenum.                            The upper GI  endoscopy was accomplished without                            difficulty. The patient tolerated the procedure. Scope In: Scope Out: Findings:                 One tongue of salmon-colored mucosa was present                            from 44 to 45 cm. No other visible abnormalities                            were present. Biopsies were taken with a cold                            forceps for histology.                           A low-grade of narrowing Schatzki ring was found at                            the gastroesophageal junction. Disruption biopsies                            were performed with cold forceps.                           No other gross mucosal lesions were noted in the                            entire esophagus. Biopsies were taken with a cold                            forceps for histology to rule out EOE/LOE. After                            the rest of the EGD was completed, a guidewire was  placed and the scope was withdrawn. Dilation was                            performed with a Savary dilator with moderate                            resistance at 16 mm and 17 mm. The esophagus was                            examined following endoscope reinsertion and showed                            at the GE junction a moderate mucosal disruption,                            moderate improvement in luminal narrowing and no                            perforation, and at the proximal esophagus just                            below the UES, a mild mucosal disruption, mild                            improvement in luminal narrowing and no                            perforation..                           A 3 cm hiatal hernia was present.                           One non-bleeding linear gastric ulcer with a clean                            ulcer base (Forrest Class III) was found in the                            hiatal hernia. The lesion was 9 mm in  largest                            dimension. This is consistent with a Cameron's                            ulcer.                           One non-bleeding linear gastric ulcer with no                            stigmata of bleeding was found in the gastric body.  The lesion was 10 mm in largest dimension.                           Patchy mildly erythematous mucosa without bleeding                            was found in the entire examined stomach. Biopsies                            were taken with a cold forceps for histology and                            Helicobacter pylori testing.                           No gross lesions were noted in the duodenal bulb,                            in the first portion of the duodenum and in the                            second portion of the duodenum. Complications:            No immediate complications. Estimated Blood Loss:     Estimated blood loss was minimal. Impression:               - 1 salmon-colored tongue noted distally in                            esophagus and biopsied to rule out Barrett's.                           - No other gross lesions in the entire esophagus.                            Biopsied. Dilated up to 17 mm savory.                           - Low-grade of narrowing Schatzki ring. Disrupted.                           - Mild mucosal wrent below the UES and a moderate                            mucosal wrent at the GE junction.                           - 3 cm hiatal hernia.                           - Non-bleeding gastric ulcer with a clean ulcer                            base (Forrest Class III).                           -  Non-bleeding gastric ulcer with no stigmata of                            bleeding.                           - Erythematous mucosa in the stomach. Biopsied.                           - No gross lesions in the duodenal bulb, in the                            first  portion of the duodenum and in the second                            portion of the duodenum. Recommendation:           - The patient will be observed post-procedure,                            until all discharge criteria are met.                           - Discharge patient to home.                           - Patient has a contact number available for                            emergencies. The signs and symptoms of potential                            delayed complications were discussed with the                            patient. Return to normal activities tomorrow.                            Written discharge instructions were provided to the                            patient.                           - Dilation diet as per protocol.                           - Omeprazole  40 mg twice daily.                           - Carafate  1 g liquid slurry twice daily for 2                            weeks.                           -  Observe patient's clinical course.                           - Await pathology results.                           - Repeat upper endoscopy in next 3 to 6 weeks for                            retreatment and further dilation to further aid in                            dysphagia symptoms.                           - The findings and recommendations were discussed                            with the patient. Aloha Finner, MD 09/18/2023 12:09:21 PM

## 2023-09-18 NOTE — Progress Notes (Signed)
 GASTROENTEROLOGY PROCEDURE H&P NOTE   Primary Care Physician: Emerick Avelina POUR, PA-C  HPI: [name] is a 78 y.o. male who presents for EGD for issues of dysphagia.  Past Medical History:  Diagnosis Date   Abnormal MRI    BRAIN   Acute renal insufficiency 02/09/2013   Anxiety    Arthritis    HSV (herpes simplex virus) infection    Hyperlipidemia    Kidney stones 02/09/2013   Nephrolithiasis    Weight loss    Past Surgical History:  Procedure Laterality Date   ANTERIOR CERVICAL DECOMP/DISCECTOMY FUSION N/A 01/18/2023   Procedure: Anterior Cervical Decompression Fusion  Cervical three-four, Cervical four-five;  Surgeon: Debby Dorn MATSU, MD;  Location: [facility] OR;  Service: Neurosurgery;  Laterality: N/A;   cataract Bilateral    REFRACTIVE SURGERY  1998   URETERAL STENT PLACEMENT  2014   Current Outpatient Medications  Medication Sig Dispense Refill   atorvastatin  (LIPITOR) 20 MG tablet Take 1 tablet (20 mg total) by mouth daily. 90 tablet 3   celecoxib (CELEBREX) 200 MG capsule Take 200 mg by mouth daily.     docusate sodium  (COLACE) 100 MG capsule Take 1 capsule (100 mg total) by mouth 2 (two) times daily. 10 capsule 0   tamsulosin  (FLOMAX ) 0.4 MG CAPS capsule Take 0.4 mg by mouth daily.     trolamine salicylate (ASPERCREME) 10 % cream Apply 1 Application topically as needed for muscle pain.     aspirin 81 MG tablet Take 1 tablet (81 mg total) by mouth 2 (two) times a week.     econazole nitrate 1 % cream Apply topically 2 (two) times daily as needed.     HYDROcodone -acetaminophen  (NORCO/VICODIN) 5-325 MG tablet Take 1 tablet by mouth every 6 (six) hours as needed for moderate pain (pain score 4-6) ((score 4 to 6)). (Patient not taking: Reported on 09/18/2023) 20 tablet 0   methocarbamol  (ROBAXIN ) 500 MG tablet Take 1 tablet (500 mg total) by mouth every 6 (six) hours as needed for muscle spasms. (Patient not taking: Reported on 09/18/2023) 120 tablet 0   sildenafil   (REVATIO ) 20 MG tablet TAKE 3 TO 5 TABLETS AS NEEDED PRIOR TO SEXUAL ACTIVITY (Patient taking differently: 50 mg. TAKE 1 TABLETS AS NEEDED PRIOR TO SEXUAL ACTIVITY) 50 tablet 0   valACYclovir  (VALTREX ) 1000 MG tablet Take 1,000 mg by mouth daily as needed (fever blisters).     Current Facility-Administered Medications  Medication Dose Route Frequency Provider Last Rate Last Admin   0.9 %  sodium chloride  infusion  500 mL Intravenous Continuous [name], MD        Current Outpatient Medications:    atorvastatin  (LIPITOR) 20 MG tablet, Take 1 tablet (20 mg total) by mouth daily., Disp: 90 tablet, Rfl: 3   celecoxib (CELEBREX) 200 MG capsule, Take 200 mg by mouth daily., Disp: , Rfl:    docusate sodium  (COLACE) 100 MG capsule, Take 1 capsule (100 mg total) by mouth 2 (two) times daily., Disp: 10 capsule, Rfl: 0   tamsulosin  (FLOMAX ) 0.4 MG CAPS capsule, Take 0.4 mg by mouth daily., Disp: , Rfl:    trolamine salicylate (ASPERCREME) 10 % cream, Apply 1 Application topically as needed for muscle pain., Disp: , Rfl:    aspirin 81 MG tablet, Take 1 tablet (81 mg total) by mouth 2 (two) times a week., Disp: , Rfl:    econazole nitrate 1 % cream, Apply topically 2 (two) times daily as needed., Disp: ,  Rfl:    HYDROcodone -acetaminophen  (NORCO/VICODIN) 5-325 MG tablet, Take 1 tablet by mouth every 6 (six) hours as needed for moderate pain (pain score 4-6) ((score 4 to 6)). (Patient not taking: Reported on 09/18/2023), Disp: 20 tablet, Rfl: 0   methocarbamol  (ROBAXIN ) 500 MG tablet, Take 1 tablet (500 mg total) by mouth every 6 (six) hours as needed for muscle spasms. (Patient not taking: Reported on 09/18/2023), Disp: 120 tablet, Rfl: 0   sildenafil  (REVATIO ) 20 MG tablet, TAKE 3 TO 5 TABLETS AS NEEDED PRIOR TO SEXUAL ACTIVITY (Patient taking differently: 50 mg. TAKE 1 TABLETS AS NEEDED PRIOR TO SEXUAL ACTIVITY), Disp: 50 tablet, Rfl: 0   valACYclovir  (VALTREX ) 1000 MG tablet, Take 1,000 mg by mouth  daily as needed (fever blisters)., Disp: , Rfl:   Current Facility-Administered Medications:    0.9 %  sodium chloride  infusion, 500 mL, Intravenous, Continuous, [name], MD No Known Allergies Family History  Problem Relation Age of Onset   CAD Mother    Melanoma Father    Stroke Sister    Breast cancer Paternal Grandmother    High blood pressure Brother    Colon cancer Neg Hx    Social History   Socioeconomic History   Marital status: Widowed    Spouse name: Not on file   Number of children: 2   Years of education: Not on file   Highest education level: Not on file  Occupational History   Occupation: RETIRED  Tobacco Use   Smoking status: Never   Smokeless tobacco: Never  Vaping Use   Vaping status: Never Used  Substance and Sexual Activity   Alcohol use: Yes    Alcohol/week: 7.0 standard drinks of alcohol    Types: 7 Shots of liquor per week    Comment: one drink a day   Drug use: No   Sexual activity: Yes  Other Topics Concern   Not on file  Social History Narrative   SOME EXERCISE.   Social Drivers of Corporate investment banker Strain: Not on file  Food Insecurity: Low Risk  (10/24/2022)   Received from [facility]   Hunger Vital Sign    Within the past 12 months, you worried that your food would run out before you got money to buy more: Never true    Within the past 12 months, the food you bought just didn't last and you didn't have money to get more. : Never true  Transportation Needs: Not on file (10/24/2022)  Physical Activity: Not on file  Stress: Not on file  Social Connections: Not on file  Intimate Partner Violence: Not on file    Physical Exam: Today's Vitals   09/18/23 1100  BP: 122/69  Pulse: 75  Temp: 98.2 F (36.8 C)  TempSrc: Temporal  SpO2: 97%  Weight: 168 lb (76.2 kg)  Height: 6' 2 (1.88 m)   Body mass index is 21.57 kg/m. GEN: NAD EYE: Sclerae anicteric ENT: MMM CV: Non-tachycardic GI: Soft, NT/ND NEURO:   Alert & Oriented x 3  Lab Results: No results for input(s): WBC, HGB, HCT, PLT in the last 72 hours. BMET No results for input(s): NA, K, CL, CO2, GLUCOSE, BUN, CREATININE, CALCIUM  in the last 72 hours. LFT No results for input(s): PROT, ALBUMIN , AST, ALT, ALKPHOS, BILITOT, BILIDIR, IBILI in the last 72 hours. PT/INR No results for input(s): LABPROT, INR in the last 72 hours.   Impression / Plan: This is a 78 y.o.male who presents for EGD  for issues of dysphagia.  The risks and benefits of endoscopic evaluation/treatment were discussed with the patient and/or family; these include but are not limited to the risk of perforation, infection, bleeding, missed lesions, lack of diagnosis, severe illness requiring hospitalization, as well as anesthesia and sedation related illnesses.  The patient's history has been reviewed, patient examined, no change in status, and deemed stable for procedure.  The patient and/or family is agreeable to proceed.    Aloha Finner, MD [facility] Advanced Endoscopy Office # [phone]

## 2023-09-18 NOTE — Progress Notes (Signed)
 Report to PACU, RN, vss, BBS= Clear.

## 2023-09-18 NOTE — Patient Instructions (Addendum)
 YOU HAD AN ENDOSCOPIC PROCEDURE TODAY AT THE [facility]:   Refer to the procedure report that was given to you for any specific questions about what was found during the examination.  If the procedure report does not answer your questions, please call your gastroenterologist to clarify.  If you requested that your care partner not be given the details of your procedure findings, then the procedure report has been included in a sealed envelope for you to review at your convenience later.  YOU SHOULD EXPECT: Some feelings of bloating in the abdomen. Passage of more gas than usual.  Walking can help get rid of the air that was put into your GI tract during the procedure and reduce the bloating. If you had a lower endoscopy (such as a colonoscopy or flexible sigmoidoscopy) you may notice spotting of blood in your stool or on the toilet paper. If you underwent a bowel prep for your procedure, you may not have a normal bowel movement for a few days.  Please Note:  You might notice some irritation and congestion in your nose or some drainage.  This is from the oxygen used during your procedure.  There is no need for concern and it should clear up in a day or so.  SYMPTOMS TO REPORT IMMEDIATELY:  Following upper endoscopy (EGD)  Vomiting of blood or coffee ground material  New chest pain or pain under the shoulder blades  Painful or persistently difficult swallowing  New shortness of breath  Fever of 100F or higher  Black, tarry-looking stools  Dilation diet as per protocol Omeprazole  - 40 mg twice daily Carafate  - 1gm liquid slurry twice daily for 2 weeks Await pathology results Repeat upper endoscopy in next 3-6 weeks for retreatment   For urgent or emergent issues, a gastroenterologist can be reached at any hour by calling [phone]. Do not use MyChart messaging for urgent concerns.    DIET:  We do recommend a small meal at first, but then you may proceed to your regular  diet.  Drink plenty of fluids but you should avoid alcoholic beverages for 24 hours.  ACTIVITY:  You should plan to take it easy for the rest of today and you should NOT DRIVE or use heavy machinery until tomorrow (because of the sedation medicines used during the test).    FOLLOW UP: Our staff will call the number listed on your records the next business day following your procedure.  We will call around 7:15- 8:00 am to check on you and address any questions or concerns that you may have regarding the information given to you following your procedure. If we do not reach you, we will leave a message.     If any biopsies were taken you will be contacted by phone or by letter within the next 1-3 weeks.  Please call us  at [phone] if you have not heard about the biopsies in 3 weeks.    SIGNATURES/CONFIDENTIALITY: You and/or your care partner have signed paperwork which will be entered into your electronic medical record.  These signatures attest to the fact that that the information above on your After Visit Summary has been reviewed and is understood.  Full responsibility of the confidentiality of this discharge information lies with you and/or your care-partner.

## 2023-09-19 ENCOUNTER — Telehealth: Payer: Self-pay | Admitting: Lactation Services

## 2023-09-19 NOTE — Telephone Encounter (Signed)
  Follow up Call-     09/18/2023   11:02 AM  Call back number  Post procedure Call Back phone  # [phone]  Permission to leave phone message Yes     Patient questions:  Do you have a fever, pain , or abdominal swelling? No. Pain Score  0 *  Have you tolerated food without any problems? Yes.    Have you been able to return to your normal activities? Yes.    Do you have any questions about your discharge instructions: Diet   No. Medications  No. Follow up visit  No.  Do you have questions or concerns about your Care? No.  Actions: * If pain score is 4 or above: No action needed, pain <4.

## 2023-09-20 LAB — SURGICAL PATHOLOGY

## 2023-09-21 ENCOUNTER — Ambulatory Visit: Payer: Self-pay | Admitting: Gastroenterology

## 2023-09-28 ENCOUNTER — Telehealth: Payer: Self-pay | Admitting: Gastroenterology

## 2023-09-28 NOTE — Telephone Encounter (Signed)
 Inbound call from patient stating he will call back to rs his EGD procedure since he is having another surgery a week prior to when he was originally scheduled which was 8/20.  He is wanting to be scheduled sometime in November. He wanted to inform Dr.Mansouraty on why he cancelled   Please advise  Thank you

## 2023-09-28 NOTE — Telephone Encounter (Signed)
Thank you for update. GM 

## 2023-09-28 NOTE — Telephone Encounter (Signed)
 Noted. Reminder message sent to call patient for rescheduling in November. Forwarded to Dr. Wilhelmenia as RICK.

## 2023-10-03 ENCOUNTER — Telehealth: Payer: Self-pay | Admitting: Gastroenterology

## 2023-10-03 NOTE — Telephone Encounter (Signed)
 The pt has been advised that he can stop the carafate  now- he was to take for 2 weeks. He will call with any concerns after stopping

## 2023-10-03 NOTE — Telephone Encounter (Signed)
 Patient called and stated that he has had a throat dilation and has been taking medication for his throat ( patient did not remember the name of the medication) and was wondering if he can come off that or should he continue taking them. Please advise.

## 2023-10-24 ENCOUNTER — Encounter: Admitting: Gastroenterology

## 2023-12-03 ENCOUNTER — Telehealth: Payer: Self-pay

## 2023-12-03 NOTE — Telephone Encounter (Signed)
 Letter mailed to pt to call and make appt as he is able.

## 2023-12-03 NOTE — Telephone Encounter (Signed)
-----   Message from Nurse Daphne D sent at 09/28/2023  2:37 PM EDT ----- Call patient to reschedule EGD. Patient cancelled for August due to another surgery. Wants to reschedule for the month of November.

## 2023-12-07 ENCOUNTER — Telehealth: Payer: Self-pay

## 2023-12-07 NOTE — Telephone Encounter (Signed)
 Attempted to reach patient. No answer, left VM for patient to return call.

## 2023-12-07 NOTE — Telephone Encounter (Signed)
-----   Message from Nurse Daphne D sent at 09/28/2023  2:37 PM EDT ----- Call patient to reschedule EGD. Patient cancelled for August due to another surgery. Wants to reschedule for the month of November.

## 2023-12-11 NOTE — Telephone Encounter (Signed)
 Spoke with pt. He reports that he is currently feeling well and is not having any problems with his swallowing. He currently does not want to reschedule his EGD, and states he would like to wait until next June to schedule due to being in and out of the state for the next few months. Advised pt that if anything changed, or if he developed any new trouble with swallowing to please call and we can reschedule his EGD. Pt verbalized understanding.

## 2024-01-08 ENCOUNTER — Ambulatory Visit (HOSPITAL_COMMUNITY)
Admission: RE | Admit: 2024-01-08 | Discharge: 2024-01-08 | Disposition: A | Discharge status: Home / Self Care | Source: Ambulatory Visit | Attending: Cardiovascular Disease | Admitting: Cardiovascular Disease

## 2024-01-08 DIAGNOSIS — E785 Hyperlipidemia, unspecified: Secondary | ICD-10-CM

## 2024-01-08 DIAGNOSIS — E782 Mixed hyperlipidemia: Secondary | ICD-10-CM

## 2024-01-08 DIAGNOSIS — Z8249 Family history of ischemic heart disease and other diseases of the circulatory system: Secondary | ICD-10-CM

## 2024-01-13 ENCOUNTER — Ambulatory Visit: Payer: Self-pay | Admitting: Cardiovascular Disease

## 2024-01-15 ENCOUNTER — Ambulatory Visit: Attending: Cardiovascular Disease | Admitting: Cardiovascular Disease

## 2024-01-15 ENCOUNTER — Encounter: Payer: Self-pay | Admitting: Cardiovascular Disease

## 2024-01-15 VITALS — BP 94/54 | HR 69 | Ht 72.0 in | Wt 164.0 lb

## 2024-01-15 DIAGNOSIS — Z8249 Family history of ischemic heart disease and other diseases of the circulatory system: Secondary | ICD-10-CM | POA: Insufficient documentation

## 2024-01-15 DIAGNOSIS — E782 Mixed hyperlipidemia: Secondary | ICD-10-CM | POA: Insufficient documentation

## 2024-01-15 NOTE — Patient Instructions (Signed)
 Medication Instructions:  No medication changes were made at this visit. Continue current regimen.   *If you need a refill on your cardiac medications before your next appointment, please call your pharmacy*  Lab Work: None ordered today. If you have labs (blood work) drawn today and your tests are completely normal, you will receive your results only by: MyChart Message (if you have MyChart) OR A paper copy in the mail If you have any lab test that is abnormal or we need to change your treatment, we will call you to review the results.  Testing/Procedures: Vascuscreen prior to 1 year follow up with Dr. Wonda  - Carotid US   - AAA  - ABI  Follow-Up: At [facility], you and your health needs are our priority.  As part of our continuing mission to provide you with exceptional heart care, our providers are all part of one team.  This team includes your primary Cardiologist (physician) and Advanced Practice Providers or APPs (Physician Assistants and Nurse Practitioners) who all work together to provide you with the care you need, when you need it.  Your next appointment:   1 year(s)  Provider:   Ozell Wonda, MD

## 2024-01-15 NOTE — Progress Notes (Signed)
 Cardiology Office Note:    Date:  01/15/2024   ID:  [name], DOB 01-23-1946, MRN [id number]  PCP:  Murleen Fine, MD   [facility] Providers Cardiologist:  Ozell Fell, MD     Referring MD: Emerick Avelina POUR, PA-C   Chief Complaint  Patient presents with   Follow-up    Hyperlipidemia    History of Present Illness:    [name] is a 78 y.o. male presenting for follow-up of mixed hyperlipidemia and family history of CAD.  The patient's most recent CT coronary calcium  score in 2021 showed a score of 53, placing him in the 26th percentile.  He was also found to have aortic atherosclerosis at that time.  The patient has been unable to tolerate low-dose statin drugs in the past, but currently is taking atorvastatin  20 mg every other day.  He had vascular ultrasound screening 01/09/2024 which showed patent carotid arteries with mild plaque visualized, normal ABIs, and normal aortic dimensions.  The patient is here alone today.  He plans to stay in [place] through the holidays, then return to [place] .  He feels well and has no complaints.  He had a pretty bad GI illness earlier this year and has lost some weight but he has not gained back yet.  Otherwise he is doing well and denies chest pain, chest pressure, or shortness of breath.  He has no heart palpitations, orthopnea, or PND.  Current Medications: Current Meds  Medication Sig   aspirin 81 MG tablet Take 1 tablet (81 mg total) by mouth 2 (two) times a week.   atorvastatin  (LIPITOR) 20 MG tablet Take 1 tablet (20 mg total) by mouth daily. (Patient taking differently: Take 20 mg by mouth every other day.)   celecoxib (CELEBREX) 200 MG capsule Take 200 mg by mouth daily. (Patient taking differently: Take 200 mg by mouth as needed for moderate pain (pain score 4-6).)   econazole nitrate 1 % cream Apply topically 2 (two) times daily as needed.   sildenafil  (REVATIO ) 20 MG tablet TAKE 3 TO 5 TABLETS AS  NEEDED PRIOR TO SEXUAL ACTIVITY (Patient taking differently: 50 mg. TAKE 1 TABLETS AS NEEDED PRIOR TO SEXUAL ACTIVITY)   sucralfate  (CARAFATE ) 1 g tablet Crush 1 tablet and dissolve in 10ml of warm water mix well to create slurry and drink BID for 14 days   tamsulosin  (FLOMAX ) 0.4 MG CAPS capsule Take 0.4 mg by mouth daily.   trolamine salicylate (ASPERCREME) 10 % cream Apply 1 Application topically as needed for muscle pain.   valACYclovir  (VALTREX ) 1000 MG tablet Take 1,000 mg by mouth daily as needed (fever blisters).     Allergies:   Patient has no known allergies.   ROS:   Please see the history of present illness.    Positive for erectile dysfunction.  All other systems reviewed and are negative.  EKGs/Labs/Other Studies Reviewed:    The following studies were reviewed today: Cardiac Studies & Procedures   ______________________________________________________________________________________________   STRESS TESTS  EXERCISE TOLERANCE TEST (ETT) 11/27/2017  Interpretation Summary  Blood pressure demonstrated a normal response to exercise.  There was no ST segment deviation noted during stress.  No T wave inversion was noted during stress.  Excellent exercise capacity.  Negative, adequate stress test.   ECHOCARDIOGRAM  ECHOCARDIOGRAM COMPLETE 11/27/2017  Narrative *[facility]* [address] [place], [place] [address] [phone]  ------------------------------------------------------------------- Transthoracic Echocardiography  Patient:    [name] MR #:  [id number] Study Date: 11/27/2017 Gender:     M Age:        9 Height:     188 cm Weight:     86.2 kg BSA:        2.12 m^2 Pt. Status: Room:  TISA Ozell Fell, MD REFERRING    Ozell Fell, MD SONOGRAPHER  Waldo [place], RCS PERFORMING   [facility], Outpatient ATTENDING    Maranda Grate  cc:  ------------------------------------------------------------------- LV EF: 60% -   65%  ------------------------------------------------------------------- Indications:      Dyspnea (R06.09).  ------------------------------------------------------------------- History:   PMH:   Stroke.  Risk factors:  Hypertension.  ------------------------------------------------------------------- Study Conclusions  - Left ventricle: The cavity size was normal. Systolic function was normal. The estimated ejection fraction was in the range of 60% to 65%. Wall motion was normal; there were no regional wall motion abnormalities. Left ventricular diastolic function parameters were normal. - Aortic valve: There was no regurgitation. - Mitral valve: There was trivial regurgitation. - Right ventricle: Systolic function was normal. - Tricuspid valve: There was mild regurgitation. - Pulmonary arteries: Systolic pressure was within the normal range. - Inferior vena cava: The vessel was normal in size. The respirophasic diameter changes were in the normal range (= 50%), consistent with normal central venous pressure.  ------------------------------------------------------------------- Labs, prior tests, procedures, and surgery: Coronary artery bypass grafting.  ------------------------------------------------------------------- Study data:  No prior study was available for comparison.  Study status:  Routine.  Procedure:  Transthoracic echocardiography. Image quality was adequate.          Transthoracic echocardiography.  M-mode, complete 2D, spectral Doppler, and color Doppler.  Birthdate:  Patient birthdate: 1945-04-15.  Age:  Patient is 78 yr old.  Sex:  Gender: male.    BMI: 24.4 kg/m^2.  Blood pressure:     111/89  Patient status:  Outpatient.  Study date: Study date: 11/27/2017. Study time: 01:15 PM.  Location:  [facility]  -------------------------------------------------------------------  ------------------------------------------------------------------- Left ventricle:  The cavity size was normal. Systolic function was normal. The estimated ejection fraction was in the range of 60% to 65%. Wall motion was normal; there were no regional wall motion abnormalities. The transmitral flow pattern was normal. The deceleration time of the early transmitral flow velocity was normal. The pulmonary vein flow pattern was normal. The tissue Doppler parameters were normal. Left ventricular diastolic function parameters were normal.  ------------------------------------------------------------------- Aortic valve:   Trileaflet; normal thickness leaflets. Mobility was not restricted.  Doppler:  Transvalvular velocity was within the normal range. There was no stenosis. There was no regurgitation.  ------------------------------------------------------------------- Aorta:  Aortic root: The aortic root was normal in size. Ascending aorta: The ascending aorta was normal in size.  ------------------------------------------------------------------- Mitral valve:   Structurally normal valve.   Mobility was not restricted.  Doppler:  Transvalvular velocity was within the normal range. There was no evidence for stenosis. There was trivial regurgitation.    Peak gradient (D): 2 mm Hg.  ------------------------------------------------------------------- Left atrium:  The atrium was normal in size.  ------------------------------------------------------------------- Right ventricle:  The cavity size was normal. Wall thickness was normal. Systolic function was normal.  ------------------------------------------------------------------- Pulmonic valve:    Structurally normal valve.   Cusp separation was normal.  Doppler:  Transvalvular velocity was within the normal range. There was no evidence for stenosis. There was  no regurgitation.  ------------------------------------------------------------------- Tricuspid valve:   Structurally normal valve.    Doppler: Transvalvular velocity was within the normal range.  There was mild regurgitation.  ------------------------------------------------------------------- Pulmonary artery:   The main pulmonary artery was normal-sized. Systolic pressure was within the normal range.  ------------------------------------------------------------------- Right atrium:  The atrium was normal in size.  ------------------------------------------------------------------- Pericardium:  There was no pericardial effusion.  ------------------------------------------------------------------- Systemic veins: Inferior vena cava: The vessel was normal in size. The respirophasic diameter changes were in the normal range (= 50%), consistent with normal central venous pressure. Diameter: 18 mm.  ------------------------------------------------------------------- Measurements  IVC                                        Value        Reference ID                                         18    mm     ----------  Left ventricle                             Value        Reference LV ID, ED, PLAX chordal            (L)     41.4  mm     43 - 52 LV ID, ES, PLAX chordal                    29    mm     23 - 38 LV fx shortening, PLAX chordal             30    %      >=29 LV PW thickness, ED                        12.4  mm     ---------- IVS/LV PW ratio, ED                        0.8          <=1.3 Stroke volume, 2D                          66    ml     ---------- Stroke volume/bsa, 2D                      31    ml/m^2 ---------- LV e&', lateral                             10.3  cm/s   ---------- LV E/e&', lateral                           7.63         ---------- LV e&', medial                              8.49  cm/s   ---------- LV E/e&', medial  9.26          ---------- LV e&', average                             9.4   cm/s   ---------- LV E/e&', average                           8.37         ----------  Ventricular septum                         Value        Reference IVS thickness, ED                          9.98  mm     ----------  LVOT                                       Value        Reference LVOT ID, S                                 20    mm     ---------- LVOT area                                  3.14  cm^2   ---------- LVOT peak velocity, S                      100   cm/s   ---------- LVOT mean velocity, S                      68.7  cm/s   ---------- LVOT VTI, S                                21    cm     ----------  Aorta                                      Value        Reference Aortic root ID, ED                         33    mm     ----------  Left atrium                                Value        Reference LA ID, A-P, ES                             31    mm     ---------- LA ID/bsa, A-P  1.46  cm/m^2 <=2.2 LA volume, S                               33.4  ml     ---------- LA volume/bsa, S                           15.7  ml/m^2 ---------- LA volume, ES, 1-p A4C                     36.6  ml     ---------- LA volume/bsa, ES, 1-p A4C                 17.2  ml/m^2 ---------- LA volume, ES, 1-p A2C                     29.1  ml     ---------- LA volume/bsa, ES, 1-p A2C                 13.7  ml/m^2 ----------  Mitral valve                               Value        Reference Mitral E-wave peak velocity                78.6  cm/s   ---------- Mitral A-wave peak velocity                60.6  cm/s   ---------- Mitral deceleration time           (H)     292   ms     150 - 230 Mitral peak gradient, D                    2     mm Hg  ---------- Mitral E/A ratio, peak                     1.3          ----------  Pulmonary arteries                         Value        Reference PA pressure, S, DP                          18    mm Hg  <=30  Tricuspid valve                            Value        Reference Tricuspid regurg peak velocity             193   cm/s   ---------- Tricuspid peak RV-RA gradient              15    mm Hg  ---------- Tricuspid maximal regurg velocity,         193   cm/s   ---------- PISA  Right atrium                               Value  Reference RA ID, S-I, ES, A4C                        48.3  mm     34 - 49 RA area, ES, A4C                           18.8  cm^2   8.3 - 19.5 RA volume, ES, A/L                         61.9  ml     ---------- RA volume/bsa, ES, A/L                     29.1  ml/m^2 ----------  Systemic veins                             Value        Reference Estimated CVP                              3     mm Hg  ----------  Right ventricle                            Value        Reference TAPSE                                      19.8  mm     ---------- RV pressure, S, DP                         18    mm Hg  <=30 RV s&', lateral, S                          11.2  cm/s   ----------  Legend: (L)  and  (H)  mark values outside specified reference range.  ------------------------------------------------------------------- Prepared and Electronically Authenticated by  Leim Moose, M.D. 2019-09-24T20:26:44      CT SCANS  CT CARDIAC SCORING (SELF PAY ONLY) 12/23/2019  Addendum 12/24/2019  8:36 AM ADDENDUM REPORT: 12/24/2019 08:33  CLINICAL DATA:  Risk stratification in 78 year old male with strong family history of CAD.  EXAM: Coronary Calcium  Score  TECHNIQUE: The patient was scanned on a [employer] scanner. Axial non-contrast 3 mm slices were carried out through the heart. The data set was analyzed on a dedicated work station and scored using the Agatson method.  FINDINGS: Non-cardiac: See separate report from [facility].  Aorta: Aortic atherosclerosis, moderate descending. Ascending root 36 mm, normal  size.  Pericardium: Normal  Coronary arteries: RCA calcification.  Normal origins.  IMPRESSION: 1. Coronary calcium  score of 53. This was 26 percentile for age and sex matched control.  2.  Aortic atherosclerosis  Oneil Parchment, MD [facility]   Electronically Signed By: Oneil Parchment MD On: 12/24/2019 08:33  Narrative EXAM: OVER-READ INTERPRETATION  CT CHEST  The following report is an over-read performed by radiologist Dr. Jackquline Boxer of [facility] on 12/23/2019. This over-read does not include interpretation of cardiac or coronary anatomy or  pathology. The coronary calcium  score interpretation by the cardiologist is attached.  COMPARISON:  CT 02/18/2014  FINDINGS: Limited view of the lung parenchyma demonstrates no suspicious nodularity. Airways are normal.  Limited view of the mediastinum demonstrates no adenopathy. Esophagus normal.  Limited view of the upper abdomen unremarkable.  Limited view of the skeleton and chest wall is unremarkable.  IMPRESSION: No significant extracardiac findings.  Electronically Signed: By: Jackquline Boxer M.D. On: 12/23/2019 14:24   CT SCANS  CT CARDIAC SCORING (SELF PAY ONLY) 02/18/2014  Addendum 02/18/2014  5:17 PM ADDENDUM REPORT: 02/18/2014 17:14  CLINICAL DATA:  Risk stratification  EXAM: Coronary Calcium  Score  TECHNIQUE: The patient was scanned on a Siemens Sensation 16 slice scanner. Axial non-contrast 3 mm slices were carried out through the heart. The data set was analyzed on a dedicated work station and scored using the Agatson method.  FINDINGS: Non-cardiac: No significant non cardiac findings on limited lung and soft tissue windows. See separate report from [facility].  Ascending Aorta:  Normal caliber.  Pericardium: Normal  Coronary arteries:  Normal origin.  IMPRESSION: Coronary calcium  score of 0. This was 0 percentile for age and sex matched control.  Leim Moose   Electronically Signed By: Leim Moose On: 02/18/2014 17:14  Narrative EXAM: OVER-READ INTERPRETATION  CT CHEST  The following report is an over-read performed by radiologist Dr. Waddell Cola [facility] on 02/18/2014. This over-read does not include interpretation of cardiac or coronary anatomy or pathology. The coronary calcium  score/coronary CTA interpretation by the cardiologist is attached.  COMPARISON:  CT abdomen and pelvis from 01/30/2013 no additional nodules identified.  FINDINGS: Mediastinum: No mediastinal adenopathy.  Lungs/Pleura: No pleural effusion identified. There is no airspace consolidation or atelectasis. Stable tiny nodule in the left lower lobe measuring 2 mm, image 49/series 4.  Upper Abdomen: The visualized portions of the liver in spleen are unremarkable.  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  IMPRESSION: 1. No acute findings. 2. Pulmonary nodule within the left lower lobe has been stable for over 12 months and is compatible with a benign abnormality. This recommendation follows the consensus statement: Guidelines for Management of Small Pulmonary Nodules Detected on CT Scans: A Statement from the Fleischner Society as published in Radiology 2005; 237:395-400.  Electronically Signed: By: Waddell Calk M.D. On: 02/18/2014 11:44     ______________________________________________________________________________________________      EKG:   EKG Interpretation Date/Time:  Tuesday January 15 2024 09:59:41 EST Ventricular Rate:  69 PR Interval:  174 QRS Duration:  84 QT Interval:  372 QTC Calculation: 398 R Axis:   77  Text Interpretation: Normal sinus rhythm Normal ECG When compared with ECG of 21-Dec-2022 09:15, No significant change was found Confirmed by Wonda Sharper [phone]) on 01/15/2024 10:16:46 AM    Recent Labs: No results found for requested labs  within last 365 days.  Recent Lipid Panel    Component Value Date/Time   CHOL 181 12/14/2022 0728   TRIG 142 12/14/2022 0728   HDL 48 12/14/2022 0728   CHOLHDL 3.8 12/14/2022 0728   CHOLHDL 3.1 06/26/2013 1018   VLDL 24 06/26/2013 1018   LDLCALC 108 (H) 12/14/2022 0728     Risk Assessment/Calculations:                Physical Exam:    VS:  BP (!) 94/54 (BP Location: Left Arm, Patient Position: Sitting, Cuff Size: Normal)   Pulse 69   Ht  6' (1.829 m)   Wt 164 lb (74.4 kg)   SpO2 97%   BMI 22.24 kg/m     Wt Readings from Last 3 Encounters:  01/15/24 164 lb (74.4 kg)  09/18/23 168 lb (76.2 kg)  07/26/23 168 lb (76.2 kg)     GEN:  Well nourished, well developed in no acute distress HEENT: Normal NECK: No JVD; No carotid bruits LYMPHATICS: No lymphadenopathy CARDIAC: RRR, no murmurs, rubs, gallops RESPIRATORY:  Clear to auscultation without rales, wheezing or rhonchi  ABDOMEN: Soft, non-tender, non-distended MUSCULOSKELETAL:  No edema; No deformity  SKIN: Warm and dry NEUROLOGIC:  Alert and oriented x 3 PSYCHIATRIC:  Normal affect   Assessment & Plan Mixed hyperlipidemia Treated with atorvastatin  20 mg every other day, with dosing limited by side effects.  I referred him last year for consideration of a PCSK9 inhibitor but he declined.  He will continue current management.  His last lipids were improved from baseline, but LDL is 90 mg/dL, previously 891 mg/dL.  He does not wish to intensify his statin therapy.  With no high risk features (coronary calcium  26 percentile), is reasonable to continue on his current medications. Family history of premature CAD As above, continue atorvastatin .  Repeat vascular screening in 1 year at the time of his return office visit.            Medication Adjustments/Labs and Tests Ordered: Current medicines are reviewed at length with the patient today.  Concerns regarding medicines are outlined above.  Orders Placed This  Encounter  Procedures   EKG 12-Lead   No orders of the defined types were placed in this encounter.   There are no Patient Instructions on file for this visit.   Signed, Ozell Fell, MD  01/15/2024 10:25 AM    [facility]

## 2024-01-15 NOTE — Assessment & Plan Note (Signed)
 Treated with atorvastatin  20 mg every other day, with dosing limited by side effects.  I referred him last year for consideration of a PCSK9 inhibitor but he declined.  He will continue current management.  His last lipids were improved from baseline, but LDL is 90 mg/dL, previously 891 mg/dL.  He does not wish to intensify his statin therapy.  With no high risk features (coronary calcium  26 percentile), is reasonable to continue on his current medications.

## 2024-01-15 NOTE — Assessment & Plan Note (Signed)
 As above, continue atorvastatin .  Repeat vascular screening in 1 year at the time of his return office visit.

## 2024-01-17 ENCOUNTER — Other Ambulatory Visit: Payer: Self-pay | Admitting: Surgery

## 2024-02-14 ENCOUNTER — Encounter (INDEPENDENT_AMBULATORY_CARE_PROVIDER_SITE_OTHER): Payer: PRIVATE HEALTH INSURANCE | Admitting: Ophthalmology

## 2024-02-14 DIAGNOSIS — H4423 Degenerative myopia, bilateral: Secondary | ICD-10-CM

## 2024-02-14 DIAGNOSIS — H43813 Vitreous degeneration, bilateral: Secondary | ICD-10-CM | POA: Diagnosis not present

## 2024-08-14 ENCOUNTER — Encounter (INDEPENDENT_AMBULATORY_CARE_PROVIDER_SITE_OTHER): Admitting: Ophthalmology
